# Patient Record
Sex: Female | Born: 1960
Health system: Southern US, Community
[De-identification: ages and names within clinical notes are randomized; demographics above are authoritative.]

## PROBLEM LIST (undated history)

## (undated) DIAGNOSIS — F419 Anxiety disorder, unspecified: Secondary | ICD-10-CM

## (undated) DIAGNOSIS — R51 Headache: Secondary | ICD-10-CM

## (undated) HISTORY — PX: SHOULDER ARTHROSCOPY: SHX128

---

## 2002-11-04 ENCOUNTER — Other Ambulatory Visit: Admission: RE | Admit: 2002-11-04 | Discharge: 2002-11-04 | Payer: Self-pay | Admitting: Obstetrics and Gynecology

## 2003-05-14 ENCOUNTER — Ambulatory Visit (HOSPITAL_BASED_OUTPATIENT_CLINIC_OR_DEPARTMENT_OTHER): Admission: RE | Admit: 2003-05-14 | Discharge: 2003-05-14 | Payer: Self-pay | Admitting: Orthopedic Surgery

## 2013-09-11 ENCOUNTER — Other Ambulatory Visit: Payer: Self-pay | Admitting: Obstetrics and Gynecology

## 2013-09-30 ENCOUNTER — Encounter (HOSPITAL_COMMUNITY): Payer: Self-pay | Admitting: *Deleted

## 2013-10-10 ENCOUNTER — Ambulatory Visit (HOSPITAL_COMMUNITY): Payer: Self-pay | Admitting: Anesthesiology

## 2013-10-10 ENCOUNTER — Encounter (HOSPITAL_COMMUNITY)
Admission: RE | Disposition: A | Discharge status: Home / Self Care | Payer: Self-pay | Source: Ambulatory Visit | Attending: Obstetrics and Gynecology

## 2013-10-10 ENCOUNTER — Encounter (HOSPITAL_COMMUNITY): Payer: Self-pay | Admitting: Pharmacy Technician

## 2013-10-10 ENCOUNTER — Ambulatory Visit (HOSPITAL_COMMUNITY)
Admission: RE | Admit: 2013-10-10 | Discharge: 2013-10-10 | Disposition: A | Discharge status: Home / Self Care | Payer: Self-pay | Source: Ambulatory Visit | Attending: Obstetrics and Gynecology | Admitting: Obstetrics and Gynecology

## 2013-10-10 ENCOUNTER — Encounter (HOSPITAL_COMMUNITY): Payer: Self-pay | Admitting: *Deleted

## 2013-10-10 ENCOUNTER — Encounter (HOSPITAL_COMMUNITY): Payer: Self-pay | Admitting: Anesthesiology

## 2013-10-10 DIAGNOSIS — N841 Polyp of cervix uteri: Secondary | ICD-10-CM | POA: Insufficient documentation

## 2013-10-10 DIAGNOSIS — N950 Postmenopausal bleeding: Secondary | ICD-10-CM | POA: Insufficient documentation

## 2013-10-10 DIAGNOSIS — N852 Hypertrophy of uterus: Secondary | ICD-10-CM | POA: Insufficient documentation

## 2013-10-10 HISTORY — PX: HYSTEROSCOPY W/D&C: SHX1775

## 2013-10-10 HISTORY — DX: Anxiety disorder, unspecified: F41.9

## 2013-10-10 HISTORY — DX: Headache: R51

## 2013-10-10 LAB — CBC
HCT: 38.2 % (ref 36.0–46.0)
Hemoglobin: 12.2 g/dL (ref 12.0–15.0)
MCH: 30.7 pg (ref 26.0–34.0)
MCHC: 31.9 g/dL (ref 30.0–36.0)
MCV: 96 fL (ref 78.0–100.0)
PLATELETS: 215 10*3/uL (ref 150–400)
RBC: 3.98 MIL/uL (ref 3.87–5.11)
RDW: 13.9 % (ref 11.5–15.5)
WBC: 2.7 10*3/uL — ABNORMAL LOW (ref 4.0–10.5)

## 2013-10-10 SURGERY — DILATATION AND CURETTAGE /HYSTEROSCOPY
Anesthesia: General | Site: Vagina

## 2013-10-10 MED ORDER — MEPERIDINE HCL 25 MG/ML IJ SOLN: 6.2500 mg | INTRAMUSCULAR | Status: DC | PRN

## 2013-10-10 MED ORDER — OXYCODONE-ACETAMINOPHEN 5-325 MG PO TABS: 1.0000 | ORAL_TABLET | ORAL | Status: DC | PRN

## 2013-10-10 MED ORDER — LIDOCAINE HCL (CARDIAC) 20 MG/ML IV SOLN
INTRAVENOUS | Status: AC
  Filled 2013-10-10: qty 5

## 2013-10-10 MED ORDER — FENTANYL CITRATE 0.05 MG/ML IJ SOLN
INTRAMUSCULAR | Status: DC | PRN
  Administered 2013-10-10 (×2): 50 ug via INTRAVENOUS

## 2013-10-10 MED ORDER — ONDANSETRON HCL 4 MG/2ML IJ SOLN
INTRAMUSCULAR | Status: AC
  Filled 2013-10-10: qty 2

## 2013-10-10 MED ORDER — DEXAMETHASONE SODIUM PHOSPHATE 10 MG/ML IJ SOLN
INTRAMUSCULAR | Status: DC | PRN
  Administered 2013-10-10: 10 mg via INTRAVENOUS

## 2013-10-10 MED ORDER — LIDOCAINE HCL 1 % IJ SOLN
INTRAMUSCULAR | Status: AC
  Filled 2013-10-10: qty 20

## 2013-10-10 MED ORDER — FENTANYL CITRATE 0.05 MG/ML IJ SOLN
INTRAMUSCULAR | Status: AC
  Filled 2013-10-10: qty 2

## 2013-10-10 MED ORDER — LACTATED RINGERS IV SOLN
INTRAVENOUS | Status: DC
  Administered 2013-10-10: 12:00:00 via INTRAVENOUS

## 2013-10-10 MED ORDER — KETOROLAC TROMETHAMINE 30 MG/ML IJ SOLN: 15.0000 mg | Freq: Once | INTRAMUSCULAR | Status: DC | PRN

## 2013-10-10 MED ORDER — KETOROLAC TROMETHAMINE 30 MG/ML IJ SOLN
INTRAMUSCULAR | Status: AC
  Filled 2013-10-10: qty 1

## 2013-10-10 MED ORDER — PROPOFOL 10 MG/ML IV EMUL
INTRAVENOUS | Status: AC
  Filled 2013-10-10: qty 20

## 2013-10-10 MED ORDER — MIDAZOLAM HCL 2 MG/2ML IJ SOLN
INTRAMUSCULAR | Status: AC
  Filled 2013-10-10: qty 2

## 2013-10-10 MED ORDER — DEXAMETHASONE SODIUM PHOSPHATE 10 MG/ML IJ SOLN
INTRAMUSCULAR | Status: AC
  Filled 2013-10-10: qty 1

## 2013-10-10 MED ORDER — KETOROLAC TROMETHAMINE 30 MG/ML IJ SOLN
INTRAMUSCULAR | Status: DC | PRN
  Administered 2013-10-10: 30 mg via INTRAVENOUS

## 2013-10-10 MED ORDER — MIDAZOLAM HCL 2 MG/2ML IJ SOLN: 0.5000 mg | Freq: Once | INTRAMUSCULAR | Status: DC | PRN

## 2013-10-10 MED ORDER — FENTANYL CITRATE 0.05 MG/ML IJ SOLN: 25.0000 ug | INTRAMUSCULAR | Status: DC | PRN

## 2013-10-10 MED ORDER — PROMETHAZINE HCL 25 MG/ML IJ SOLN: 6.2500 mg | INTRAMUSCULAR | Status: DC | PRN

## 2013-10-10 MED ORDER — LIDOCAINE HCL 1 % IJ SOLN
INTRAMUSCULAR | Status: DC | PRN
  Administered 2013-10-10: 10 mL

## 2013-10-10 MED ORDER — MIDAZOLAM HCL 2 MG/2ML IJ SOLN
INTRAMUSCULAR | Status: DC | PRN
  Administered 2013-10-10: 2 mg via INTRAVENOUS

## 2013-10-10 MED ORDER — PROPOFOL 10 MG/ML IV BOLUS
INTRAVENOUS | Status: DC | PRN
  Administered 2013-10-10: 160 mg via INTRAVENOUS

## 2013-10-10 MED ORDER — ONDANSETRON HCL 4 MG/2ML IJ SOLN
INTRAMUSCULAR | Status: DC | PRN
Start: 2013-10-10 — End: 2013-10-10
  Administered 2013-10-10: 4 mg via INTRAVENOUS

## 2013-10-10 MED ORDER — LIDOCAINE HCL (CARDIAC) 20 MG/ML IV SOLN
INTRAVENOUS | Status: DC | PRN
Start: 2013-10-10 — End: 2013-10-10
  Administered 2013-10-10: 30 mg via INTRAVENOUS

## 2013-10-10 MED ORDER — GLYCINE 1.5 % IR SOLN
Status: DC | PRN
  Administered 2013-10-10: 3000 mL

## 2013-10-10 SURGICAL SUPPLY — 22 items
CANISTER SUCT 3000ML (MISCELLANEOUS) ×3 IMPLANT
CATH ROBINSON RED A/P 16FR (CATHETERS) ×3 IMPLANT
CLOTH BEACON ORANGE TIMEOUT ST (SAFETY) ×3 IMPLANT
CONTAINER PREFILL 10% NBF 60ML (FORM) ×6 IMPLANT
DRAPE HYSTEROSCOPY (DRAPE) ×3 IMPLANT
DRSG TELFA 3X8 NADH (GAUZE/BANDAGES/DRESSINGS) ×3 IMPLANT
ELECT REM PT RETURN 9FT ADLT (ELECTROSURGICAL) ×3
ELECTRODE REM PT RTRN 9FT ADLT (ELECTROSURGICAL) ×1 IMPLANT
GLOVE BIO SURGEON STRL SZ 6.5 (GLOVE) ×2 IMPLANT
GLOVE BIO SURGEONS STRL SZ 6.5 (GLOVE) ×1
GLOVE BIOGEL PI IND STRL 6.5 (GLOVE) ×1 IMPLANT
GLOVE BIOGEL PI INDICATOR 6.5 (GLOVE) ×2
GOWN STRL REUS W/TWL LRG LVL3 (GOWN DISPOSABLE) ×6 IMPLANT
LOOP ANGLED CUTTING 22FR (CUTTING LOOP) IMPLANT
NEEDLE SPNL 22GX3.5 QUINCKE BK (NEEDLE) ×3 IMPLANT
PACK VAGINAL MINOR WOMEN LF (CUSTOM PROCEDURE TRAY) ×3 IMPLANT
PAD OB MATERNITY 4.3X12.25 (PERSONAL CARE ITEMS) ×3 IMPLANT
SET TUBING HYSTEROSCOPY 2 NDL (TUBING) ×3 IMPLANT
SYR CONTROL 10ML LL (SYRINGE) ×3 IMPLANT
TOWEL OR 17X24 6PK STRL BLUE (TOWEL DISPOSABLE) ×6 IMPLANT
TUBE HYSTEROSCOPY W Y-CONNECT (TUBING) ×3 IMPLANT
WATER STERILE IRR 1000ML POUR (IV SOLUTION) ×3 IMPLANT

## 2013-10-10 NOTE — Anesthesia Preprocedure Evaluation (Signed)
Anesthesia Evaluation  Patient identified by MRN, date of birth, ID band Patient awake    Reviewed: Allergy & Precautions, H&P , Patient's Chart, lab work & pertinent test results, reviewed documented beta blocker date and time   History of Anesthesia Complications Negative for: history of anesthetic complications  Airway Mallampati: II  TM Distance: >3 FB Neck ROM: full    Dental   Pulmonary  breath sounds clear to auscultation        Cardiovascular Exercise Tolerance: Good Rhythm:regular Rate:Normal     Neuro/Psych negative psych ROS   GI/Hepatic   Endo/Other    Renal/GU      Musculoskeletal   Abdominal   Peds  Hematology   Anesthesia Other Findings   Reproductive/Obstetrics                             Anesthesia Physical Anesthesia Plan  ASA: II  Anesthesia Plan: General LMA   Post-op Pain Management:    Induction:   Airway Management Planned:   Additional Equipment:   Intra-op Plan:   Post-operative Plan:   Informed Consent: I have reviewed the patients History and Physical, chart, labs and discussed the procedure including the risks, benefits and alternatives for the proposed anesthesia with the patient or authorized representative who has indicated his/her understanding and acceptance.   Dental Advisory Given  Plan Discussed with: CRNA, Surgeon and Anesthesiologist  Anesthesia Plan Comments:         Anesthesia Quick Evaluation  

## 2013-10-10 NOTE — Discharge Instructions (Signed)
DISCHARGE INSTRUCTIONS: D&C The following instructions have been prepared to help you care for yourself upon your return home.  MAY TAKE IBUPROFEN (MOTRIN, ADVIL) OR ALEVE AFTER 6:20 PM FOR PAIN!!!   Personal hygiene:  Use sanitary pads for vaginal drainage, not tampons.  Shower the day after your procedure.  NO tub baths, pools or Jacuzzis for 2-3 weeks.  Wipe front to back after using the bathroom.  Activity and limitations:  Do NOT drive or operate any equipment for 24 hours. The effects of anesthesia are still present and drowsiness may result.  Do NOT rest in bed all day.  Walking is encouraged.  Walk up and down stairs slowly.  You may resume your normal activity in one to two days or as indicated by your physician.  Sexual activity: NO intercourse for at least 2 weeks after the procedure, or as indicated by your physician.  Diet: Eat a light meal as desired this evening. You may resume your usual diet tomorrow.  Return to work: You may resume your work activities in one to two days or as indicated by your doctor.  What to expect after your surgery: Expect to have vaginal bleeding/discharge for 2-3 days and spotting for up to 10 days. It is not unusual to have soreness for up to 1-2 weeks. You may have a slight burning sensation when you urinate for the first day. Mild cramps may continue for a couple of days. You may have a regular period in 2-6 weeks.  Call your doctor for any of the following:  Excessive vaginal bleeding, saturating and changing one pad every hour.  Inability to urinate 6 hours after discharge from hospital.  Pain not relieved by pain medication.  Fever of 100.4 F or greater.  Unusual vaginal discharge or odor.   Call for an appointment:    Patients signature: ______________________  Nurses signature ________________________  Support person's signature_______________________

## 2013-10-10 NOTE — H&P (Signed)
53 y.o. complains of postcoital and postmenopausal bleeding.  Prior to her initial appt 03/14/2013 she had not had care in approx 10 yrs.  On initial exam enlarged uterus was found.  [place] was performed 04/04/13 and showed 10cm mass with obscured EMS, D&C ordered.  Pt failed to proceed.  Pap smear showed ASCUS cannot r/o HG, but HPV was negative.  Colpo was performed and no lesions visualized. ECC was benign.  She was contacted and situation discussed.  She is here today for schedule D&C hysteroscopy for further evaluation.  No past medical history on file. Past Surgical History  Procedure Laterality Date  . Shoulder arthroscopy      History   Social History  . Marital Status: Married    Spouse Name: N/A    Number of Children: N/A  . Years of Education: N/A   Occupational History  . Not on file.   Social History Main Topics  . Smoking status: Never Smoker   . Smokeless tobacco: Never Used  . Alcohol Use: No  . Drug Use: No  . Sexual Activity: Not on file   Other Topics Concern  . Not on file   Social History Narrative  . No narrative on file    No current facility-administered medications on file prior to encounter.   No current outpatient prescriptions on file prior to encounter.    Allergies not on file  @VITALS2 @  Lungs: clear to ascultation Cor:  RRR Abdomen:  soft, nontender, nondistended. Ex:  no cords, erythema Pelvic:  Deferred to OR  A:  Postmenopausal and postcoital bleeding with enlarged uterus containing mass   P:  D&C hysteroscopy.   All risks, benefits and alternatives d/w patient and she desires to proceed.  Routine pre-op care. No abx needed.   Allyn Kenner

## 2013-10-10 NOTE — Brief Op Note (Signed)
10/10/2013  12:28 PM  PATIENT:  Kelsey Gilmore  53 y.o. female  PRE-OPERATIVE DIAGNOSIS:  POSTMENOPAUSAL BLEEDING  POST-OPERATIVE DIAGNOSIS:  POSTMENOPAUSAL BLEEDING  PROCEDURE:  Fractional dilation, curettage and hysteroscopy, cervical polyp removal  SURGEON:  Surgeon(s) and Role:    Allyn Kenner, DO - Primary  ANESTHESIA:   general  EBL:  Total I/O In: -  Out: 25 [Urine:25]   DRAINS: none   LOCAL MEDICATIONS USED:  LIDOCAINE   SPECIMEN:  Source of Specimen:  ECC, EMC, cervical polyp  DISPOSITION OF SPECIMEN:  PATHOLOGY  COUNTS:  YES  PLAN OF CARE: Admit to inpatient   PATIENT DISPOSITION:  PACU - hemodynamically stable.

## 2013-10-10 NOTE — Anesthesia Postprocedure Evaluation (Signed)
Anesthesia Post Note  Patient: Kelsey Gilmore  Procedure(s) Performed: Procedure(s) (LRB): DILATATION AND CURETTAGE /HYSTEROSCOPY;  (N/A)  Anesthesia type: GA  Patient location: PACU  Post pain: Pain level controlled  Post assessment: Post-op Vital signs reviewed  Last Vitals:  Filed Vitals:   10/10/13 1300  BP:   Pulse: 55  Temp:   Resp: 23    Post vital signs: Reviewed  Level of consciousness: sedated  Complications: No apparent anesthesia complications

## 2013-10-10 NOTE — Transfer of Care (Signed)
Immediate Anesthesia Transfer of Care Note  Patient: Kelsey Gilmore  Procedure(s) Performed: Procedure(s): DILATATION AND CURETTAGE /HYSTEROSCOPY;  (N/A)  Patient Location: PACU  Anesthesia Type:General  Level of Consciousness: awake, oriented, sedated and patient cooperative  Airway & Oxygen Therapy: Patient Spontanous Breathing and Patient connected to nasal cannula oxygen  Post-op Assessment: Report given to PACU RN and Post -op Vital signs reviewed and stable  Post vital signs: Reviewed and stable  Complications: No apparent anesthesia complications

## 2013-10-11 NOTE — Op Note (Signed)
NAME:  Kelsey Gilmore, Kelsey Gilmore NO.:  [id number]  MEDICAL RECORD NO.:  [id number]  LOCATION:  [facility]                          FACILITY:  [facility]  PHYSICIAN:  Allyn Kenner, DO    DATE OF BIRTH:  10-Apr-1961  DATE OF PROCEDURE:  10/10/2013 DATE OF DISCHARGE:  10/10/2013                              OPERATIVE REPORT   PREOPERATIVE DIAGNOSIS:  Postmenopausal bleeding.  POSTOPERATIVE DIAGNOSIS:  Postmenopausal bleeding.  PROCEDURE:  Fractional dilation and curettage and hysteroscopy with cervical polyp removal.  SURGEON:  Allyn Kenner, DO  ANESTHESIA:  General.  URINE OUTPUT:  25 mL.  FLUIDS:  Please see anesthesia records.  ESTIMATED BLOOD LOSS:  Minimal.  LOCAL MEDICATION USED:  A 10 mL of 1% lidocaine for paracervical block.  SPECIMENS:  ECC, EMP, cervical polyp.  DISPOSITION:  To pathology.  FINDINGS:  Normal appearing endometrial cavity with uterus sounded to 10 cm.  A pedunculated cervical polyp noted within the cervical canal.  DESCRIPTION OF PROCEDURE:  The patient was taken to the operating room where anesthesia was administered and found to be adequate.  She was prepped and draped in the normal sterile fashion in dorsal lithotomy position.  A speculum was placed in the vagina and the anterior lip of the cervix was grasped with a single-tooth tenaculum.  The uterus was sounded to 10 cm, and the cervix was dilated to 25 Pratt.  A hysteroscope was introduced through the cervical canal, and the uterine cavity observed and found to be normal.  With removal of the hysteroscope, cervical polyp was noted within the cervical canal.  The hysteroscope was removed and gentle curettage of all 4 quadrants was performed.  The hysteroscope was reintroduced, and the cervical polyp was still present.  The hysteroscope was removed and polyp forceps were used to grasp the polyp and remove it.  The hysteroscope was introduced one final time and cervical polyp was found to  be absent.  All instruments were removed.  Tenaculum sites were found to be hemostatic.  No bleeding from cervical os was found.  The speculum was removed.  The patient tolerated the procedure well.  Sponge, lap, and needle counts were correct x2. The patient was taken to recovery in stable condition.          ______________________________ Allyn Kenner, DO     [place]/MEDQ  D:  10/10/2013  T:  10/11/2013  Job:  [id number]

## 2013-10-13 ENCOUNTER — Encounter (HOSPITAL_COMMUNITY): Payer: Self-pay | Admitting: Obstetrics and Gynecology

## 2020-06-01 ENCOUNTER — Emergency Department (HOSPITAL_COMMUNITY): Payer: Self-pay

## 2020-06-01 ENCOUNTER — Observation Stay (HOSPITAL_COMMUNITY): Payer: Self-pay

## 2020-06-01 ENCOUNTER — Encounter (HOSPITAL_COMMUNITY): Payer: Self-pay | Admitting: Internal Medicine

## 2020-06-01 ENCOUNTER — Other Ambulatory Visit: Payer: Self-pay

## 2020-06-01 ENCOUNTER — Inpatient Hospital Stay (HOSPITAL_COMMUNITY)
Admission: EM | Admit: 2020-06-01 | Discharge: 2020-06-04 | DRG: 809 | Disposition: A | Discharge status: Home / Self Care | Payer: Self-pay | Source: Ambulatory Visit | Attending: Internal Medicine | Admitting: Internal Medicine

## 2020-06-01 DIAGNOSIS — Z20822 Contact with and (suspected) exposure to covid-19: Secondary | ICD-10-CM | POA: Diagnosis present

## 2020-06-01 DIAGNOSIS — D259 Leiomyoma of uterus, unspecified: Secondary | ICD-10-CM | POA: Diagnosis present

## 2020-06-01 DIAGNOSIS — Z800 Family history of malignant neoplasm of digestive organs: Secondary | ICD-10-CM

## 2020-06-01 DIAGNOSIS — E538 Deficiency of other specified B group vitamins: Secondary | ICD-10-CM | POA: Diagnosis present

## 2020-06-01 DIAGNOSIS — K219 Gastro-esophageal reflux disease without esophagitis: Secondary | ICD-10-CM | POA: Diagnosis present

## 2020-06-01 DIAGNOSIS — B961 Klebsiella pneumoniae [K. pneumoniae] as the cause of diseases classified elsewhere: Secondary | ICD-10-CM | POA: Diagnosis present

## 2020-06-01 DIAGNOSIS — R112 Nausea with vomiting, unspecified: Secondary | ICD-10-CM | POA: Diagnosis present

## 2020-06-01 DIAGNOSIS — Y762 Prosthetic and other implants, materials and accessory obstetric and gynecological devices associated with adverse incidents: Secondary | ICD-10-CM | POA: Diagnosis present

## 2020-06-01 DIAGNOSIS — T8332XA Displacement of intrauterine contraceptive device, initial encounter: Secondary | ICD-10-CM | POA: Diagnosis present

## 2020-06-01 DIAGNOSIS — D539 Nutritional anemia, unspecified: Secondary | ICD-10-CM | POA: Diagnosis present

## 2020-06-01 DIAGNOSIS — N390 Urinary tract infection, site not specified: Secondary | ICD-10-CM | POA: Diagnosis present

## 2020-06-01 DIAGNOSIS — D61818 Other pancytopenia: Principal | ICD-10-CM | POA: Diagnosis present

## 2020-06-01 DIAGNOSIS — D649 Anemia, unspecified: Secondary | ICD-10-CM | POA: Diagnosis present

## 2020-06-01 LAB — COMPREHENSIVE METABOLIC PANEL
ALT: 35 U/L (ref 0–44)
AST: 127 U/L — ABNORMAL HIGH (ref 15–41)
Albumin: 4.6 g/dL (ref 3.5–5.0)
Alkaline Phosphatase: 42 U/L (ref 38–126)
Anion gap: 10 (ref 5–15)
BUN: 10 mg/dL (ref 6–20)
CO2: 29 mmol/L (ref 22–32)
Calcium: 9.7 mg/dL (ref 8.9–10.3)
Chloride: 102 mmol/L (ref 98–111)
Creatinine, Ser: 0.51 mg/dL (ref 0.44–1.00)
GFR, Estimated: >60 mL/min (ref >60)
Glucose, Bld: 96 mg/dL (ref 70–99)
Potassium: 3.5 mmol/L (ref 3.5–5.1)
Sodium: 141 mmol/L (ref 135–145)
Total Bilirubin: 0.9 mg/dL (ref 0.3–1.2)
Total Protein: 6.9 g/dL (ref 6.5–8.1)

## 2020-06-01 LAB — CBC
HCT: 14.7 % — ABNORMAL LOW (ref 36.0–46.0)
Hemoglobin: 4.8 g/dL — CL (ref 12.0–15.0)
MCH: 35.5 pg — ABNORMAL HIGH (ref 26.0–34.0)
MCH: 35.8 pg — ABNORMAL HIGH (ref 26.0–34.0)
MCHC: 32.7 g/dL (ref 30.0–36.0)
MCV: 109.7 fL — ABNORMAL HIGH (ref 80.0–100.0)
Platelets: 139 10*3/uL — ABNORMAL LOW (ref 150–400)
RBC: 1.34 MIL/uL — ABNORMAL LOW (ref 3.87–5.11)
RDW: 30.7 % — ABNORMAL HIGH (ref 11.5–15.5)
WBC: 3 10*3/uL — ABNORMAL LOW (ref 4.0–10.5)
WBC: 2.8 10*3/uL — ABNORMAL LOW (ref 4.0–10.5)
nRBC: 0.7 % — ABNORMAL HIGH (ref 0.0–0.2)

## 2020-06-01 LAB — PREPARE RBC (CROSSMATCH)

## 2020-06-01 LAB — DIFFERENTIAL
Abs Immature Granulocytes: 0.04 10*3/uL (ref 0.00–0.07)
Basophils Absolute: 0 10*3/uL (ref 0.0–0.1)
Basophils Relative: 0 %
Eosinophils Absolute: 0 10*3/uL (ref 0.0–0.5)
Eosinophils Relative: 1 %
Immature Granulocytes: 1 %
Lymphocytes Relative: 28 %
Lymphs Abs: 0.8 10*3/uL (ref 0.7–4.0)
Monocytes Absolute: 0.1 10*3/uL (ref 0.1–1.0)
Monocytes Relative: 4 %
Neutro Abs: 1.9 10*3/uL (ref 1.7–7.7)
Neutrophils Relative %: 66 %

## 2020-06-01 LAB — BPAM RBC
Blood Product Expiration Date: 202201052359
ISSUE DATE / TIME: 202112072105

## 2020-06-01 LAB — ABO/RH: ABO/RH(D): A POS

## 2020-06-01 LAB — RESP PANEL BY RT-PCR (FLU A&B, COVID) ARPGX2
Influenza A by PCR: NEGATIVE
Influenza B by PCR: NEGATIVE
SARS Coronavirus 2 by RT PCR: NEGATIVE

## 2020-06-01 LAB — IRON AND TIBC
Iron: 140 ug/dL (ref 28–170)
Saturation Ratios: 59 % — ABNORMAL HIGH (ref 10.4–31.8)
TIBC: 238 ug/dL — ABNORMAL LOW (ref 250–450)
UIBC: 98 ug/dL

## 2020-06-01 LAB — FOLATE: Folate: 22.5 ng/mL (ref >5.9)

## 2020-06-01 LAB — TECHNOLOGIST SMEAR REVIEW

## 2020-06-01 LAB — VITAMIN B12: Vitamin B-12: 65 pg/mL — ABNORMAL LOW (ref 180–914)

## 2020-06-01 LAB — RETICULOCYTES
Immature Retic Fract: 11 % (ref 2.3–15.9)
RBC.: 1.35 MIL/uL — ABNORMAL LOW (ref 3.87–5.11)
Retic Count, Absolute: 17.1 10*3/uL — ABNORMAL LOW (ref 19.0–186.0)
Retic Ct Pct: 1.3 % (ref 0.4–3.1)

## 2020-06-01 LAB — OCCULT BLOOD X 1 CARD TO LAB, STOOL: Fecal Occult Bld: NEGATIVE

## 2020-06-01 LAB — FERRITIN: Ferritin: 229 ng/mL (ref 11–307)

## 2020-06-01 MED ORDER — SODIUM CHLORIDE 0.9% FLUSH
3.0000 mL | Freq: Two times a day (BID) | INTRAVENOUS | Status: DC
  Administered 2020-06-03 – 2020-06-04 (×3): 3 mL via INTRAVENOUS

## 2020-06-01 MED ORDER — ONDANSETRON HCL 4 MG/2ML IJ SOLN: 4.0000 mg | Freq: Four times a day (QID) | INTRAMUSCULAR | Status: DC | PRN

## 2020-06-01 MED ORDER — SODIUM CHLORIDE 0.9 % IV SOLN: 250.0000 mL | INTRAVENOUS | Status: DC | PRN

## 2020-06-01 MED ORDER — PANTOPRAZOLE SODIUM 40 MG PO TBEC
40.0000 mg | DELAYED_RELEASE_TABLET | Freq: Two times a day (BID) | ORAL | Status: DC
  Administered 2020-06-01: 40 mg via ORAL
  Filled 2020-06-01: qty 1

## 2020-06-01 MED ORDER — ACETAMINOPHEN 650 MG RE SUPP: 650.0000 mg | Freq: Four times a day (QID) | RECTAL | Status: DC | PRN

## 2020-06-01 MED ORDER — IOHEXOL 300 MG/ML  SOLN
100.0000 mL | Freq: Once | INTRAMUSCULAR | Status: AC | PRN
  Administered 2020-06-01: 100 mL via INTRAVENOUS

## 2020-06-01 MED ORDER — SODIUM CHLORIDE 0.9% IV SOLUTION
Freq: Once | INTRAVENOUS | Status: AC
  Administered 2020-06-01: 10 mL/h via INTRAVENOUS

## 2020-06-01 MED ORDER — ACETAMINOPHEN 325 MG PO TABS: 650.0000 mg | ORAL_TABLET | Freq: Four times a day (QID) | ORAL | Status: DC | PRN

## 2020-06-01 MED ORDER — ONDANSETRON HCL 4 MG PO TABS: 4.0000 mg | ORAL_TABLET | Freq: Four times a day (QID) | ORAL | Status: DC | PRN

## 2020-06-01 MED ORDER — SODIUM CHLORIDE 0.9% FLUSH: 3.0000 mL | INTRAVENOUS | Status: DC | PRN

## 2020-06-01 NOTE — ED Notes (Signed)
Pt getting first unit of blood now unable to collect labs at this time.

## 2020-06-01 NOTE — ED Provider Notes (Signed)
[facility] EMERGENCY DEPARTMENT Provider Note   CSN: [id number] Arrival date & time: 06/01/20  1328     History Chief Complaint  Patient presents with  . Abnormal Lab    Kelsey Gilmore is a 59 y.o. female presenting for evaluation of weakness and low blood counts.  Patient states she has been feeling weaker than normal.  She reports associated lightheadedness, mostly when standing up.  This is been going on for several months, but worsened in the past week.  She reports associated intermittent nausea and vomiting, she is not currently having any nausea.  She intermittently has lower/mid abdominal pain, none currently.  She denies fevers, chills, chest pain, shortness of breath, urinary symptoms, abnormal bowel movements.  She reports no medical problems, but states she does not see a doctor regularly.  Due to her weakness, she saw her doctor yesterday, had blood work drawn which showed a hemoglobin of 5.5, she was directed to the ER.  She reports denies a previous history of anemia.  She has never needed a blood transfusion.  She does not currently have periods.  She denies blood in her stool, urine, or emesis.  She is not on blood thinners.  She takes no medicines daily.  She does not take NSAIDs.  She states she has never seen a GI doctor, never had a colonoscopy.  HPI     Past Medical History:  Diagnosis Date  . Anxiety    no meds  . Headache(784.0)    migraines occasional - last one 1 month ago - otc med prn  . SVD (spontaneous vaginal delivery)    x 1    There are no problems to display for this patient.   Past Surgical History:  Procedure Laterality Date  . HYSTEROSCOPY WITH D & C N/A 10/10/2013   Procedure: DILATATION AND CURETTAGE /HYSTEROSCOPY; ;  Surgeon: Allyn Kenner, DO;  Location: [facility];  Service: Gynecology;  Laterality: N/A;  . SHOULDER ARTHROSCOPY     left     OB History   No obstetric history on file.     No family history on  file.  Social History   Tobacco Use  . Smoking status: Never Smoker  . Smokeless tobacco: Never Used  Substance Use Topics  . Alcohol use: Yes    Comment: ocaasional  . Drug use: No    Home Medications Prior to Admission medications   Medication Sig Start Date End Date Taking? Authorizing Provider  oxyCODONE-acetaminophen (ROXICET) 5-325 MG per tablet Take 1 tablet by mouth every 4 (four) hours as needed for severe pain. 10/10/13   Allyn Kenner, DO    Allergies    Patient has no known allergies.  Review of Systems   Review of Systems  Gastrointestinal: Positive for abdominal pain (intermittent) and vomiting (intermittent).  Neurological: Positive for weakness and light-headedness.  All other systems reviewed and are negative.   Physical Exam Updated Vital Signs BP (!) 123/52   Pulse 79   Temp 98.3 F (36.8 C)   Resp 19   Ht _0  (1.727 m)   Wt 78.9 kg   SpO2 100%   BMI 26.45 kg/m   Physical Exam Vitals and nursing note reviewed. Exam conducted with a chaperone present.  Constitutional:      General: She is not in acute distress.    Appearance: She is well-developed.     Comments: Appears pale  HENT:     Head: Normocephalic and atraumatic.  Mouth/Throat:     Mouth: Mucous membranes are pale.  Eyes:     Extraocular Movements: Extraocular movements intact.     Conjunctiva/sclera: Conjunctivae normal.     Pupils: Pupils are equal, round, and reactive to light.  Cardiovascular:     Rate and Rhythm: Normal rate and regular rhythm.     Pulses: Normal pulses.  Pulmonary:     Effort: Pulmonary effort is normal. No respiratory distress.     Breath sounds: Normal breath sounds. No wheezing.  Abdominal:     General: There is no distension.     Palpations: Abdomen is soft. There is no mass.     Tenderness: There is no abdominal tenderness. There is no guarding or rebound.     Comments: No TTP.  Firmness of lower abdomen.  No rigidity, guarding, distention.   Genitourinary:    Comments: No gross bleeding noted on rectal exam.  Hemoccult sent. Musculoskeletal:        General: Normal range of motion.     Cervical back: Normal range of motion and neck supple.  Skin:    General: Skin is warm and dry.     Capillary Refill: Capillary refill takes less than 2 seconds.     Coloration: Skin is pale.  Neurological:     Mental Status: She is alert and oriented to person, place, and time.     ED Results / Procedures / Treatments   Labs (all labs ordered are listed, but only abnormal results are displayed) Labs Reviewed  COMPREHENSIVE METABOLIC PANEL - Abnormal; Notable for the following components:      Result Value   AST 127 (*)    All other components within normal limits  CBC - Abnormal; Notable for the following components:   WBC 3.0 (*)    nRBC 1.0 (*)    All other components within normal limits  RESP PANEL BY RT-PCR (FLU A&B, COVID) ARPGX2  DIFFERENTIAL  VITAMIN B12  FOLATE  IRON AND TIBC  FERRITIN  RETICULOCYTES  OCCULT BLOOD X 1 CARD TO LAB, STOOL  POC OCCULT BLOOD, ED  TYPE AND SCREEN  ABO/RH  PREPARE RBC (CROSSMATCH)    EKG None  Radiology No results found.  Procedures .Critical Care Performed by: Franchot Heidelberg, PA-C Authorized by: Franchot Heidelberg, PA-C   Critical care provider statement:    Critical care time (minutes):  45   Critical care time was exclusive of:  Separately billable procedures and treating other patients and teaching time   Critical care was necessary to treat or prevent imminent or life-threatening deterioration of the following conditions:  Circulatory failure   Critical care was time spent personally by me on the following activities:  Blood draw for specimens, development of treatment plan with patient or surrogate, evaluation of patient's response to treatment, examination of patient, obtaining history from patient or surrogate, ordering and performing treatments and interventions,  ordering and review of laboratory studies, re-evaluation of patient's condition, pulse oximetry, ordering and review of radiographic studies and review of old charts   I assumed direction of critical care for this patient from another provider in my specialty: no   Comments:     Pt with critically low hgb of 5.4, requiring blood transfusion and admission.    (including critical care time)  Medications Ordered in ED Medications  0.9 %  sodium chloride infusion (Manually program via Guardrails IV Fluids) (has no administration in time range)    ED Course  I have reviewed  the triage vital signs and the nursing notes.  Pertinent labs & imaging results that were available during my care of the patient were reviewed by me and considered in my medical decision making (see chart for details).    MDM Rules/Calculators/A&P                          Patient presenting for evaluation of generalized weakness and low blood counts.  On exam, patient appears pale in the conjunctive a, mucous membranes, palms of the hand.  No gross bleeding noted on rectal exam, Hemoccult sent.  Labs obtained from triage interpreted by me, shows hemoglobin of 5.4.  Mild elevation in AST, however alk phos and ALT normal.  This correlates with patient's lab work from yesterday.  Will obtain CT imaging due to firmness of the lower abdomen to rule out mass, as well as further evaluation of the gallbladder due to intermittent vomiting and elevated AST.  Patient will also need a blood transfusion due to her symptomatic anemia.  Will call for admission.  Discussed with Dr. [place] Pink from [facility] hospitalist service, patient to be admitted.  Final Clinical Impression(s) / ED Diagnoses Final diagnoses:  Symptomatic anemia    Rx / DC Orders ED Discharge Orders    None       Franchot Heidelberg, PA-C 06/01/20 1717    Tegeler, Gwenyth Allegra, MD 06/01/20 1730

## 2020-06-01 NOTE — ED Notes (Signed)
Patient transported to CT 

## 2020-06-01 NOTE — ED Triage Notes (Signed)
Pt was called by her PCP today to inform her that her Hgb yesterday was 5.5. Pt feeling generalized weakness.

## 2020-06-01 NOTE — ED Notes (Signed)
Received pt from previous RN with ongoing 2nd unit of blood transfusion, pt denies any chest pain, shortness of breath or itching. Call bell within reach.

## 2020-06-01 NOTE — H&P (Signed)
[facility] History and Physical  Kelsey Gilmore ZOX:[id number] DOB: 1960/09/10 DOA: 06/01/2020   PCP: No primary care provider on file.  Specialists: None  Chief Complaint: Shortness of breath with exertion  HPI: Kelsey Gilmore is a 59 y.o. female with no significant past medical history who does not go to doctors for regular care.  She is a very poor historian.  Her daughter is at the bedside.  Apparently the patient has been feeling more fatigued than usual with shortness of breath with exertion over the past few weeks.  She also mentions other symptoms including nausea and vomiting.  She has had acid reflux over the past several weeks.  She thinks she may have lost some weight but unable to specifically quantify.  She thinks it may be 10 pounds.  Denies any blood in the stool, black-colored stool or blood in the urine or vaginal bleeding or spotting.  Denies any abdominal pain per se but her sister thinks that she may have had some abdominal discomfort over the last few weeks.  Once again patient is a very poor historian.  She went to the urgent care center and had blood work done which apparently revealed a hemoglobin of 5.5.  She was asked to go to the emergency department.  Blood work in the ED here also shows similar hemoglobin.  Patient has never had a colonoscopy.  There is family history of colon cancer.  Patient will be brought in for blood transfusion and may need endoscopy.  Home Medications: Prior to Admission medications   Medication Sig Start Date End Date Taking? Authorizing Provider  oxyCODONE-acetaminophen (ROXICET) 5-325 MG per tablet Take 1 tablet by mouth every 4 (four) hours as needed for severe pain. 10/10/13   Allyn Kenner, DO    Allergies: No Known Allergies  Past Medical History: Past Medical History:  Diagnosis Date  . Anxiety    no meds  . Headache(784.0)    migraines occasional - last one 1 month ago - otc med prn  . SVD (spontaneous vaginal  delivery)    x 1    Past Surgical History:  Procedure Laterality Date  . HYSTEROSCOPY WITH D & C N/A 10/10/2013   Procedure: DILATATION AND CURETTAGE /HYSTEROSCOPY; ;  Surgeon: Allyn Kenner, DO;  Location: [facility];  Service: Gynecology;  Laterality: N/A;  . SHOULDER ARTHROSCOPY     left    Social History: She denies smoking alcohol use or recreational drug use.  Usually independent with daily activities.  She mentions that she is quite active at baseline.  Family History:  Family History  Problem Relation Age of Onset  . Colon cancer Mother   There is also colon cancer in her aunts on her mother side.  There is also history of other kinds of cancer including prostate cancer and breast cancer in the family.   Review of Systems - History obtained from the patient General ROS: positive for  - fatigue and weight loss Psychological ROS: negative Ophthalmic ROS: negative ENT ROS: negative Allergy and Immunology ROS: negative Hematological and Lymphatic ROS: positive for - weight loss Endocrine ROS: negative Respiratory ROS: As in HPI Cardiovascular ROS: As in HPI Gastrointestinal ROS: positive for - heartburn and nausea/vomiting Genito-Urinary ROS: no dysuria, trouble voiding, or hematuria Musculoskeletal ROS: negative Neurological ROS: no TIA or stroke symptoms Dermatological ROS: negative  Physical Examination  Vitals:   06/01/20 1705 06/01/20 1711 06/01/20 1715 06/01/20 1726  BP:   124/65 (!) 122/59  Pulse:  89  77 77  Resp: 17  16 17   Temp:  98 F (36.7 C)  98.1 F (36.7 C)  TempSrc:  Oral  Oral  SpO2: 94%  100% 100%  Weight:      Height:        BP (!) 122/59 (BP Location: Right Arm)   Pulse 77   Temp 98.1 F (36.7 C) (Oral)   Resp 17   Ht 5\' 8"  (1.727 m)   Wt 78.9 kg   SpO2 100%   BMI 26.45 kg/m   General appearance: alert, cooperative, appears stated age and no distress Head: Normocephalic, without obvious abnormality, atraumatic Eyes: Pallor noted.  Muddy sclera.  Extraocular movements are intact. Throat: lips, mucosa, and tongue normal; teeth and gums normal Back: symmetric, no curvature. ROM normal. No CVA tenderness. Resp: clear to auscultation bilaterally Cardio: regular rate and rhythm, S1, S2 normal, no murmur, click, rub or gallop GI: Abdomen is soft.  Fullness appreciated in the suprapubic area.  Nontender.  No ulcers or organomegaly appreciated. Extremities: extremities normal, atraumatic, no cyanosis or edema Pulses: 2+ and symmetric Skin: Skin color, texture, turgor normal. No rashes or lesions Lymph nodes: Cervical, supraclavicular, and axillary nodes normal. Neurologic: Alert and oriented x3.  No obvious focal neurological deficits.    Labs on Admission: I have personally reviewed following labs and imaging studies  CBC: Recent Labs  Lab 06/01/20 1433  WBC 3.0*  HGB 5.4*  HCT 16.9*  MCV 111.2*  PLT 161   Basic Metabolic Panel: Recent Labs  Lab 06/01/20 1433  NA 141  K 3.5  CL 102  CO2 29  GLUCOSE 96  BUN 10  CREATININE 0.51  CALCIUM 9.7   GFR: Estimated Creatinine Clearance: 82.5 mL/min (by C-G formula based on SCr of 0.51 mg/dL). Liver Function Tests: Recent Labs  Lab 06/01/20 1433  AST 127*  ALT 35  ALKPHOS 42  BILITOT 0.9  PROT 6.9  ALBUMIN 4.6     Radiological Exams on Admission: CT abdomen pelvis is pending    Problem List  Active Problems:   Symptomatic anemia   Macrocytic anemia   GERD (gastroesophageal reflux disease)   Nausea and vomiting   Assessment: This is a 59 year old African-American female with no significant past medical history who presents with a few week history of nausea, vomiting, abdominal pain, fatigue and is found to have severe anemia.  She has macrocytic anemia.  Etiology for anemia is not clear.  No acute bleeding has been noted.  Hemoccult is negative.  However there is a significant family history of colon cancer.  Other differentials include  hemolytic anemia although patient does not have any other systemic symptoms and her bilirubin is noted to be normal.   Plan:  1. Macrocytic anemia with significant symptoms: Patient will be transfused PRBCs.  2 units have been ordered.  Anemia panel is pending.  She is noted to be leukopenic as well. We will also check LDH haptoglobin.  Peripheral smear examination.  2. Nausea vomiting with symptoms of heartburn: Reason for her symptoms not entirely clear.  We will place her on PPI.  Follow-up on CT scan of the abdomen pelvis.  There is fullness in her suprapubic area.  She also mentions weight loss.  Discussed with gastroenterology.  Considering her symptoms as well as her severe anemia may need to consider endoscopy this individual.  Will wait and see what her CT scan and her anemia work-up reveals.  Since she usually does  not follow-up with doctors it may be beneficial to do this while she is in the hospital.  3. Elevated AST: Patient denies any history of alcohol use.  Rest of LFTs are normal.  Recheck tomorrow.  Check hepatitis panel.   DVT Prophylaxis: SCDs for now Code Status: Full code Family Communication: Discussed with the patient and her daughter Disposition: Hopefully return home when improved Consults called: [facility] Admission Status: Status is: Observation  The patient remains OBS appropriate and will d/c before 2 midnights.  Dispo: The patient is from: Home              Anticipated d/c is to: Home              Anticipated d/c date is: 1 day              Patient currently is not medically stable to d/c.     Severity of Illness: The appropriate patient status for this patient is OBSERVATION. Observation status is judged to be reasonable and necessary in order to provide the required intensity of service to ensure the patient's safety. The patient's presenting symptoms, physical exam findings, and initial radiographic and laboratory data in the context of their  medical condition is felt to place them at decreased risk for further clinical deterioration. Furthermore, it is anticipated that the patient will be medically stable for discharge from the hospital within 2 midnights of admission. The following factors support the patient status of observation.   " The patient's presenting symptoms include fatigue. " The physical exam findings include pallor. " The initial radiographic and laboratory data are significant for severe anemia.     Further management decisions will depend on results of further testing and patient's response to treatment.   Kelsey Gilmore  [facility] operational officer on [email or web address]  06/01/2020, 5:29 PM

## 2020-06-02 ENCOUNTER — Observation Stay (HOSPITAL_COMMUNITY): Payer: Self-pay

## 2020-06-02 ENCOUNTER — Encounter (HOSPITAL_COMMUNITY): Payer: Self-pay | Admitting: Internal Medicine

## 2020-06-02 DIAGNOSIS — D649 Anemia, unspecified: Secondary | ICD-10-CM

## 2020-06-02 DIAGNOSIS — D539 Nutritional anemia, unspecified: Secondary | ICD-10-CM

## 2020-06-02 DIAGNOSIS — Z800 Family history of malignant neoplasm of digestive organs: Secondary | ICD-10-CM

## 2020-06-02 DIAGNOSIS — K219 Gastro-esophageal reflux disease without esophagitis: Secondary | ICD-10-CM

## 2020-06-02 LAB — TYPE AND SCREEN: Unit division: 0

## 2020-06-02 LAB — CBC
HCT: 16.9 % — ABNORMAL LOW (ref 36.0–46.0)
HCT: 21.1 % — ABNORMAL LOW (ref 36.0–46.0)
Hemoglobin: 5.4 g/dL — CL (ref 12.0–15.0)
Hemoglobin: 6.8 g/dL — CL (ref 12.0–15.0)
MCH: 32.7 pg (ref 26.0–34.0)
MCHC: 32 g/dL (ref 30.0–36.0)
MCHC: 32.2 g/dL (ref 30.0–36.0)
MCV: 111.2 fL — ABNORMAL HIGH (ref 80.0–100.0)
MCV: 101.4 fL — ABNORMAL HIGH (ref 80.0–100.0)
Platelets: 158 10*3/uL (ref 150–400)
Platelets: 114 10*3/uL — ABNORMAL LOW (ref 150–400)
RBC: 1.52 MIL/uL — ABNORMAL LOW (ref 3.87–5.11)
RBC: 2.08 MIL/uL — ABNORMAL LOW (ref 3.87–5.11)
RDW: 30.5 % — ABNORMAL HIGH (ref 11.5–15.5)
WBC: 2.8 10*3/uL — ABNORMAL LOW (ref 4.0–10.5)
nRBC: 1 % — ABNORMAL HIGH (ref 0.0–0.2)
nRBC: 0.7 % — ABNORMAL HIGH (ref 0.0–0.2)

## 2020-06-02 LAB — COMPREHENSIVE METABOLIC PANEL
ALT: 33 U/L (ref 0–44)
AST: 124 U/L — ABNORMAL HIGH (ref 15–41)
Albumin: 4 g/dL (ref 3.5–5.0)
Alkaline Phosphatase: 36 U/L — ABNORMAL LOW (ref 38–126)
Anion gap: 11 (ref 5–15)
BUN: 9 mg/dL (ref 6–20)
CO2: 27 mmol/L (ref 22–32)
Calcium: 9.3 mg/dL (ref 8.9–10.3)
Chloride: 104 mmol/L (ref 98–111)
Creatinine, Ser: 0.54 mg/dL (ref 0.44–1.00)
GFR, Estimated: >60 mL/min (ref >60)
Glucose, Bld: 90 mg/dL (ref 70–99)
Potassium: 3.2 mmol/L — ABNORMAL LOW (ref 3.5–5.1)
Sodium: 142 mmol/L (ref 135–145)
Total Bilirubin: 1.1 mg/dL (ref 0.3–1.2)
Total Protein: 5.8 g/dL — ABNORMAL LOW (ref 6.5–8.1)

## 2020-06-02 LAB — PREPARE RBC (CROSSMATCH)

## 2020-06-02 LAB — HEMOGLOBIN AND HEMATOCRIT, BLOOD
HCT: 26.6 % — ABNORMAL LOW (ref 36.0–46.0)
Hemoglobin: 8.9 g/dL — ABNORMAL LOW (ref 12.0–15.0)

## 2020-06-02 LAB — URINALYSIS, ROUTINE W REFLEX MICROSCOPIC
Glucose, UA: NEGATIVE mg/dL
Ketones, ur: 20 mg/dL — AB
Nitrite: NEGATIVE
Protein, ur: 100 mg/dL — AB
Specific Gravity, Urine: 1.03 (ref 1.005–1.030)
WBC, UA: >50 WBC/hpf — ABNORMAL HIGH (ref 0–5)
pH: 6 (ref 5.0–8.0)

## 2020-06-02 LAB — PROTIME-INR
INR: 1.2 (ref 0.8–1.2)
Prothrombin Time: 14.7 seconds (ref 11.4–15.2)

## 2020-06-02 LAB — HEPATITIS PANEL, ACUTE
HCV Ab: NONREACTIVE
Hep A IgM: NONREACTIVE
Hep B C IgM: NONREACTIVE
Hepatitis B Surface Ag: NONREACTIVE

## 2020-06-02 LAB — LACTATE DEHYDROGENASE: LDH: 5275 U/L — ABNORMAL HIGH (ref 98–192)

## 2020-06-02 LAB — MAGNESIUM: Magnesium: 2.2 mg/dL (ref 1.7–2.4)

## 2020-06-02 LAB — HIV ANTIBODY (ROUTINE TESTING W REFLEX): HIV Screen 4th Generation wRfx: NONREACTIVE

## 2020-06-02 LAB — BPAM RBC: Unit Type and Rh: 6200

## 2020-06-02 LAB — TSH: TSH: 3.74 u[IU]/mL (ref 0.350–4.500)

## 2020-06-02 MED ORDER — CYANOCOBALAMIN 1000 MCG/ML IJ SOLN
1000.0000 ug | Freq: Every day | INTRAMUSCULAR | Status: DC
  Administered 2020-06-02 – 2020-06-04 (×3): 1000 ug via INTRAMUSCULAR
  Filled 2020-06-02 (×3): qty 1

## 2020-06-02 MED ORDER — PANTOPRAZOLE SODIUM 40 MG PO TBEC
40.0000 mg | DELAYED_RELEASE_TABLET | Freq: Every day | ORAL | Status: DC
  Administered 2020-06-02 – 2020-06-04 (×3): 40 mg via ORAL
  Filled 2020-06-02 (×3): qty 1

## 2020-06-02 MED ORDER — ADULT MULTIVITAMIN W/MINERALS CH
1.0000 | ORAL_TABLET | Freq: Every day | ORAL | Status: DC
  Administered 2020-06-02 – 2020-06-04 (×3): 1 via ORAL
  Filled 2020-06-02 (×3): qty 1

## 2020-06-02 MED ORDER — SODIUM CHLORIDE 0.9% IV SOLUTION: Freq: Once | INTRAVENOUS | Status: DC

## 2020-06-02 MED ORDER — GADOBUTROL 1 MMOL/ML IV SOLN
7.5000 mL | Freq: Once | INTRAVENOUS | Status: AC | PRN
  Administered 2020-06-02: 7.5 mL via INTRAVENOUS

## 2020-06-02 MED ORDER — ENSURE ENLIVE PO LIQD
237.0000 mL | Freq: Two times a day (BID) | ORAL | Status: DC
  Administered 2020-06-02 – 2020-06-04 (×3): 237 mL via ORAL

## 2020-06-02 MED ORDER — POTASSIUM CHLORIDE CRYS ER 20 MEQ PO TBCR
40.0000 meq | EXTENDED_RELEASE_TABLET | Freq: Once | ORAL | Status: AC
  Administered 2020-06-02: 40 meq via ORAL
  Filled 2020-06-02: qty 2

## 2020-06-02 MED ORDER — SODIUM CHLORIDE 0.9 % IV SOLN
1.0000 g | INTRAVENOUS | Status: DC
  Administered 2020-06-02 – 2020-06-03 (×2): 1 g via INTRAVENOUS
  Filled 2020-06-02 (×2): qty 10

## 2020-06-02 NOTE — ED Notes (Signed)
Report called to Shayne Alken, RN

## 2020-06-02 NOTE — Plan of Care (Signed)
  Problem: Education: Goal: Knowledge of General Education information will improve Description Including pain rating scale, medication(s)/side effects and non-pharmacologic comfort measures Outcome: Progressing   

## 2020-06-02 NOTE — Consult Note (Addendum)
[facility] Consult: 8:44 AM 06/02/2020  LOS: 0 days    Referring Provider: Dr Curly Rim  Primary Care Physician: None, never goes to the doctor. Primary Gastroenterologist:  Althia Forts    Reason for Consultation: Macrocytic anemia   HPI: Kelsey Gilmore is a 59 y.o. female.  PMH anxiety.  Occasional headaches.  Surgeries include hysteroscopy, D&C, shoulder arthroscopy. Only listed med is Roxicet as needed.  Experiencing fatigue, shortness of breath over the past few weeks.  Acid reflux over several weeks with green/bilious/nonbloody emesis but not necessarily nausea.  Emesis/regurgitation not related to p.o. intake.  Mild discomfort in her lower abdomen for a few months..  Possible 10 pound weight loss.  Anorexia.  She eats a lot of fruit and legumes but overall p.o. intake has decreased over the last few months.  BMs are brown and occur once or twice a week which has been her norm for many years.  Never saw bloody or tarry stools.  Normally she is pretty active around her house and helping out with her 65 year old mother who lives nearby but lately has had no energy to do much of anything. Hgb 5.5 at urgent care yesterday and sent to ED. Now admitted, Hgb 6.8 after PRBC.  Pancytopenia as WBCs 2.8, platelets 114.  MCV 109.  B12 low.  Iron, iron sats, ferritin, folate okay.  Low TIBC. Chemistries with K3.2.  Other than AST of 124 (ALT 33), LFTs within normal limits. FOBT negative.  CTAP w contrast shows tiny hypodense lesions scattered throughout the liver, the largest is 1.5 cm on the right.  PV patent.  Pancreas, stomach, bowel unremarkable.  Moderate stool in colon large.  9 cm submucosal intrauterine fibroid displacing IUD.  Several other calcified uterine fibroids. Trace bil pleural effusions.  The idea  of pursuing EGD and colonoscopy was discussed with patient.  She does not like to incur financial debt and is not sure whether she wants to pursue a lot of medical testing.  Family history positive for colon cancer.  Her mother had colon cancer in her 7s or 38s but is alive at 59.  She has 3 sisters, one of them has some sort of cancer that metastasized to her bones.  Unclear types of cancers in her maternal aunts.\  Patient lives alone in [place].  She was divorced in 2011.  No history of alcohol use, tobacco.  Pt denies ETOH use.     Past Medical History:  Diagnosis Date  . Anxiety    no meds  . Headache(784.0)    migraines occasional - last one 1 month ago - otc med prn  . SVD (spontaneous vaginal delivery)    x 1    Past Surgical History:  Procedure Laterality Date  . HYSTEROSCOPY WITH D & C N/A 10/10/2013   Procedure: DILATATION AND CURETTAGE /HYSTEROSCOPY; ;  Surgeon: Allyn Kenner, DO;  Location: [facility];  Service: Gynecology;  Laterality: N/A;  . SHOULDER ARTHROSCOPY     left    Prior to Admission medications   Medication Sig Start  Date End Date Taking? Authorizing Provider  oxyCODONE-acetaminophen (ROXICET) 5-325 MG per tablet Take 1 tablet by mouth every 4 (four) hours as needed for severe pain. Patient not taking: Reported on 06/01/2020 10/10/13   Allyn Kenner, DO    Scheduled Meds: . sodium chloride   Intravenous Once  . pantoprazole  40 mg Oral BID  . sodium chloride flush  3 mL Intravenous Q12H   Infusions: . sodium chloride     PRN Meds: sodium chloride, acetaminophen **OR** acetaminophen, ondansetron **OR** ondansetron (ZOFRAN) IV, sodium chloride flush   Allergies as of 06/01/2020  . (No Known Allergies)    Family History  Problem Relation Age of Onset  . Colon cancer Mother     Social History   Socioeconomic History  . Marital status: Divorced    Spouse name: Not on file  . Number of children: Not on file  . Years of education: Not  on file  . Highest education level: Not on file  Occupational History  . Not on file  Tobacco Use  . Smoking status: Never Smoker  . Smokeless tobacco: Never Used  Substance and Sexual Activity  . Alcohol use: Yes    Comment: ocaasional  . Drug use: No  . Sexual activity: Yes    Birth control/protection: Post-menopausal  Other Topics Concern  . Not on file  Social History Narrative  . Not on file   Social Determinants of Health   Financial Resource Strain:   . Difficulty of Paying Living Expenses: Not on file  Food Insecurity:   . Worried About Charity fundraiser in the Last Year: Not on file  . Ran Out of Food in the Last Year: Not on file  Transportation Needs:   . Lack of Transportation (Medical): Not on file  . Lack of Transportation (Non-Medical): Not on file  Physical Activity:   . Days of Exercise per Week: Not on file  . Minutes of Exercise per Session: Not on file  Stress:   . Feeling of Stress : Not on file  Social Connections:   . Frequency of Communication with Friends and Family: Not on file  . Frequency of Social Gatherings with Friends and Family: Not on file  . Attends Religious Services: Not on file  . Active Member of Clubs or Organizations: Not on file  . Attends Archivist Meetings: Not on file  . Marital Status: Not on file  Intimate Partner Violence:   . Fear of Current or Ex-Partner: Not on file  . Emotionally Abused: Not on file  . Physically Abused: Not on file  . Sexually Abused: Not on file    REVIEW OF SYSTEMS: Constitutional: Weakness, fatigue.  This is what prompted her visit to urgent care yesterday ENT:  No nose bleeds Pulm: Some dyspnea on exertion, no cough. CV:  No palpitations, no LE edema.  No angina GU:  No hematuria, no frequency GI: See HPI Heme: See HPI.  Denies unusual bleeding or bruising. Transfusions: Prior to the last 24 hours, no previous blood transfusions. Neuro: Numbness in the tips of her fingers.   No headaches, no peripheral tingling or numbness.  No syncope, no seizures Derm:  No itching, no rash or sores.  Endocrine:  No sweats or chills.  No polyuria or dysuria Immunization: Not queried Travel:  None beyond local counties in last few months.    PHYSICAL EXAM: Vital signs in last 24 hours: Vitals:   06/02/20 0700 06/02/20 0715  BP:  121/64 (!) 126/59  Pulse: 61 62  Resp: 20 16  Temp:  98.4 F (36.9 C)  SpO2: 99% 99%   Wt Readings from Last 3 Encounters:  06/01/20 78.9 kg  10/10/13 78.9 kg    General: Well-appearing, comfortable appearing Head: No facial asymmetry or swelling.  No signs of head trauma Eyes: Conjunctiva pale.  No scleral icterus.  EOMI Ears: Not hard of hearing Nose: Congestion or discharge Mouth: Oropharynx/mucosa pink, moist, clear.  Good dentition with some missing teeth.  Tongue midline. Neck: No JVD, no thyromegaly. Lungs: Clear bilaterally, somewhat diminished at the bases.  No labored breathing.  No cough Heart: RRR.  No MRG.  S1, S2 present Abdomen: Soft.  Active bowel sounds.  Not distended.  No tenderness.  No HSM, bruits, hernias, masses..   Rectal: Deferred.  Stool submitted for testing then last 24 hours was FOBT negative. Musc/Skeltl: No joint redness, swelling or gross deformities. Extremities: No CCE.  Feet are warm. Neurologic: Alert.  Oriented x3.  Moves all 4 limbs.  No tremor or asterixis. Skin: No rash, no sores, no telangiectasia Nodes: No cervical adenopathy Psych: Cooperative, calm.  Intake/Output from previous day: 12/07 0701 - 12/08 0700 In: 980 [I.V.:350; Blood:630] Out: 0  Intake/Output this shift: No intake/output data recorded.  LAB RESULTS: Recent Labs    06/01/20 1433 06/01/20 1630 06/02/20 0350  WBC 3.0* 2.8* 2.8*  HGB 5.4* 4.8* 6.8*  HCT 16.9* 14.7* 21.1*  PLT 158 139* 114*   BMET Lab Results  Component Value Date   NA 142 06/02/2020   NA 141 06/01/2020   K 3.2 (L) 06/02/2020   K 3.5 06/01/2020    CL 104 06/02/2020   CL 102 06/01/2020   CO2 27 06/02/2020   CO2 29 06/01/2020   GLUCOSE 90 06/02/2020   GLUCOSE 96 06/01/2020   BUN 9 06/02/2020   BUN 10 06/01/2020   CREATININE 0.54 06/02/2020   CREATININE 0.51 06/01/2020   CALCIUM 9.3 06/02/2020   CALCIUM 9.7 06/01/2020   LFT Recent Labs    06/01/20 1433 06/02/20 0350  PROT 6.9 5.8*  ALBUMIN 4.6 4.0  AST 127* 124*  ALT 35 33  ALKPHOS 42 36*  BILITOT 0.9 1.1   PT/INR Lab Results  Component Value Date   INR 1.2 06/02/2020   Hepatitis Panel No results for input(s): HEPBSAG, HCVAB, HEPAIGM, HEPBIGM in the last 72 hours. C-Diff No components found for: CDIFF Lipase  No results found for: LIPASE  Drugs of Abuse  No results found for: LABOPIA, COCAINSCRNUR, LABBENZ, AMPHETMU, THCU, LABBARB   RADIOLOGY STUDIES: CT ABDOMEN PELVIS W CONTRAST  Result Date: 06/01/2020 CLINICAL DATA:  Abdominal pain anemia EXAM: CT ABDOMEN AND PELVIS WITH CONTRAST TECHNIQUE: Multidetector CT imaging of the abdomen and pelvis was performed using the standard protocol following bolus administration of intravenous contrast. CONTRAST:  18mL OMNIPAQUE IOHEXOL 300 MG/ML  SOLN COMPARISON:  None. FINDINGS: Lower chest: The visualized heart size within normal limits. No pericardial fluid/thickening. No hiatal hernia. A trace bilateral pleural effusion is seen. Hepatobiliary: Tiny hypodense lesions are scattered throughout the liver the largest measuring 1.5 cm in the posterior right liver lobe. Main portal vein is patent. No evidence of calcified gallstones, gallbladder wall thickening or biliary dilatation. Pancreas: Unremarkable. No pancreatic ductal dilatation or surrounding inflammatory changes. Spleen: Normal in size without focal abnormality. Adrenals/Urinary Tract: Both adrenal glands appear normal. The kidneys and collecting system appear normal without evidence of urinary tract calculus or hydronephrosis. Bladder is  unremarkable. Stomach/Bowel:  The stomach, small bowel, and colon are normal in appearance. No inflammatory changes, wall thickening, or obstructive findings. A moderate amount of colonic stool is present. Vascular/Lymphatic: There are no enlarged mesenteric, retroperitoneal, or pelvic lymph nodes. No significant vascular findings are present. Reproductive: There is a large heterogeneously enhancing 9 cm rounded mass likely submucosal extending into the endometrial canal. There is scattered several other probable uterine fibroid seen along the posterior right fundus with partial calcifications. An IUD is seen displaced into the right in the endometrial canal. Other: No evidence of abdominal wall mass or hernia. Musculoskeletal: No acute or significant osseous findings. IMPRESSION: Large probable 9 cm submucosal intrauterine fibroid displacing the IUD. Several other partially calcified uterine fibroids. Moderate amount colonic stool without evidence of colitis. Trace bilateral pleural effusions. Electronically Signed   By: Prudencio Pair M.D.   On: 06/01/2020 20:33     IMPRESSION:   *    Macrocytic anemia.  Labs show low B12 level.  Pancytopenia.   3 units blood thus far w interim increase Hgb (after 2 PRBC) to 6.8.   sxs include dyspnea, weakness.    *   Recent GERC sxs w non-bloody emesis.    *    Isolated elevation AST. Hypodensities of liver on contrast CTAP.  *     Lack of general medical care.  *     Uterine fibroids.    PLAN:     *   Begin B12 supplementation.  Defer mgt to attending MD.    *    Check chronic hepatitis B and C serologies    Azucena Freed  06/02/2020, 8:44 AM Phone [phone]

## 2020-06-02 NOTE — Consult Note (Signed)
[facility] CONSULT NOTE  Patient Care Team: Patient, No Pcp Per as PCP - General (General Practice)   ASSESSMENT & PLAN:   Severe pancytopenia I have reviewed her peripheral blood smear Anisocytosis is seen with fragmented blood cells Platelet count is adequate without clumping Reduced white blood cell count noted without abnormal morphology Teardrop cells are noted  She is found to have severe vitamin B12 deficiency, either nutritionally related, malabsorption patient or increase consumption We discussed the risk and benefits of bone marrow aspirate and biopsy but the patient declined for now She is appropriately transfused I recommend close observation for the next 48 hours prior to discharge to ensure stability  Severe vitamin B12 deficiency I recommend daily vitamin B12 replacement therapy intramuscularly for 7 days She can continue high-dose vitamin B12 replacement therapy in the outpatient setting We discussed the risk and benefits of oral versus parenteral B12 replacement therapy and she is in agreement to proceed  Urinary tract infection I recommend 2 days of IV antibiotics If she her condition stabilized, she can receive oral antibiotics in the outpatient  Pelvic mass Recommend outpatient follow-up with gynecologist  Discharge planning We discussed the risk and benefits of bone marrow biopsy in future follow-up She would like to discuss this with her primary service I did offer her follow-up but the patient is concerned about her lack of insurance I reassured her that she will be seen in the outpatient clinic regardless of insurance status For now, I have not made outpatient follow-up I addressed all her questions and concerns and relayed the information to primary service  All questions were answered. The patient knows to call the clinic with any problems, questions or concerns.  Heath Lark, MD 12/8/20214:01 PM   CHIEF COMPLAINTS/PURPOSE OF  CONSULTATION:  Severe pancytopenia, pelvic mass  HISTORY OF PRESENTING ILLNESS:  Kelsey Gilmore 59 y.o. female is seen at the request by primary service for evaluation for severe pancytopenia  The patient is not working.  She lives at home by herself.  She has 1 daughter Her daughter is present in her room Starting around the summer, she started to have reduced stamina, dizziness, lightheadedness and shortness of breath on exertion The patient is not aware she has prior diagnosis of anemia. On review of her electronic records, her last CBC on October 10, 2013 showed normal hemoglobin of 12.2, platelet count of 215 and borderline low white blood cell count of 2.7 The patient stated she have no insurance or primary care doctor  She denies prior history of menorrhagia.  She have IUD placed for contraceptive purposes.  She cannot remember when she had her last menstrual cycle. She presented to the emergency department after feeling poorly with excessive fatigue and worsening shortness of breath  On admission, her CBC showed white count of 3.0, hemoglobin 5.4 and platelet count of 158,000.  That was subsequently repeated and confirmed persistent pancytopenia.  She received several units of blood transfusion and was admitted for further work-up Other pertinent test results include presence of abnormal urinalysis.  She was started on antibiotics for UTI.  She did have some nausea prior to admission  CT imaging of her abdomen showed Large probable 9 cm submucosal intrauterine fibroid displacing the IUD.  Several other partially calcified uterine fibroids.  Moderate amount colonic stool without evidence of colitis.  Trace bilateral pleural effusions.  MRI of her pelvis showed  1. Large central uterine mass with resulting displacement of the endometrium and an  intrauterine device to the right. Several other smaller uterine masses are present, some of which were calcified on CT. No aggressive  characteristics are demonstrated to suggest malignancy, and these findings are most consistent with uterine fibroids. However, fibroids are not reliably differentiated from leiomyosarcomas by imaging, and if that remains a clinical concern, surgical excision/hysterectomy should be considered. 2. Normal appearance of the ovaries. 3. Bone marrow reconversion pattern.  She had not noticed any recent bleeding such as epistaxis, hematuria or hematochezia The patient denies over the counter NSAID ingestion. She is not on antiplatelets agents.  She had no prior history or diagnosis of cancer. Her age appropriate screening programs are not up-to-date.  She cannot remember when she had a mammogram done.  She never have abnormal Pap smear She denies any pica and eats a variety of diet, although she stated she does not like to eat meat She never donated blood or received blood transfusion until present admission  Other pertinent work-up including low reticulocyte count, very low vitamin B12 level, no signs of iron deficiency and normal folate level.  She has no signs of renal failure on examination  MEDICAL HISTORY:  Past Medical History:  Diagnosis Date  . Anxiety    no meds  . Headache(784.0)    migraines occasional - last one 1 month ago - otc med prn  . SVD (spontaneous vaginal delivery)    x 1    SURGICAL HISTORY: Past Surgical History:  Procedure Laterality Date  . HYSTEROSCOPY WITH D & C N/A 10/10/2013   Procedure: DILATATION AND CURETTAGE /HYSTEROSCOPY; ;  Surgeon: Allyn Kenner, DO;  Location: [facility];  Service: Gynecology;  Laterality: N/A;  . SHOULDER ARTHROSCOPY     left    SOCIAL HISTORY: Social History   Socioeconomic History  . Marital status: Divorced    Spouse name: Not on file  . Number of children: Not on file  . Years of education: Not on file  . Highest education level: Not on file  Occupational History  . Not on file  Tobacco Use  . Smoking status: Never Smoker   . Smokeless tobacco: Never Used  Substance and Sexual Activity  . Alcohol use: Yes    Comment: ocaasional  . Drug use: No  . Sexual activity: Yes    Birth control/protection: Post-menopausal  Other Topics Concern  . Not on file  Social History Narrative  . Not on file   Social Determinants of Health   Financial Resource Strain:   . Difficulty of Paying Living Expenses: Not on file  Food Insecurity:   . Worried About Charity fundraiser in the Last Year: Not on file  . Ran Out of Food in the Last Year: Not on file  Transportation Needs:   . Lack of Transportation (Medical): Not on file  . Lack of Transportation (Non-Medical): Not on file  Physical Activity:   . Days of Exercise per Week: Not on file  . Minutes of Exercise per Session: Not on file  Stress:   . Feeling of Stress : Not on file  Social Connections:   . Frequency of Communication with Friends and Family: Not on file  . Frequency of Social Gatherings with Friends and Family: Not on file  . Attends Religious Services: Not on file  . Active Member of Clubs or Organizations: Not on file  . Attends Archivist Meetings: Not on file  . Marital Status: Not on file  Intimate Partner Violence:   .  Fear of Current or Ex-Partner: Not on file  . Emotionally Abused: Not on file  . Physically Abused: Not on file  . Sexually Abused: Not on file    FAMILY HISTORY: Family History  Problem Relation Age of Onset  . Colon cancer Mother     ALLERGIES:  has No Known Allergies.  MEDICATIONS:  Current Facility-Administered Medications  Medication Dose Route Frequency Provider Last Rate Last Admin  . 0.9 %  sodium chloride infusion (Manually program via Guardrails IV Fluids)   Intravenous Once Shalhoub, Sherryll Burger, MD      . 0.9 %  sodium chloride infusion  250 mL Intravenous PRN Bonnielee Haff, MD      . acetaminophen (TYLENOL) tablet 650 mg  650 mg Oral Q6H PRN Bonnielee Haff, MD       Or  . acetaminophen  (TYLENOL) suppository 650 mg  650 mg Rectal Q6H PRN Bonnielee Haff, MD      . cefTRIAXone (ROCEPHIN) 1 g in sodium chloride 0.9 % 100 mL IVPB  1 g Intravenous Q24H Bonnielee Haff, MD 200 mL/hr at 06/02/20 1436 1 g at 06/02/20 1436  . cyanocobalamin ((VITAMIN B-12)) injection 1,000 mcg  1,000 mcg Intramuscular Daily Bonnielee Haff, MD   1,000 mcg at 06/02/20 1444  . feeding supplement (ENSURE ENLIVE / ENSURE PLUS) liquid 237 mL  237 mL Oral BID BM Bonnielee Haff, MD      . multivitamin with minerals tablet 1 tablet  1 tablet Oral Daily Bonnielee Haff, MD      . ondansetron [facility]) tablet 4 mg  4 mg Oral Q6H PRN Bonnielee Haff, MD       Or  . ondansetron [facility]) injection 4 mg  4 mg Intravenous Q6H PRN Bonnielee Haff, MD      . pantoprazole (PROTONIX) EC tablet 40 mg  40 mg Oral Daily Gribbin, Sarah J, PA-C      . sodium chloride flush (NS) 0.9 % injection 3 mL  3 mL Intravenous Q12H Bonnielee Haff, MD      . sodium chloride flush (NS) 0.9 % injection 3 mL  3 mL Intravenous PRN Bonnielee Haff, MD        REVIEW OF SYSTEMS:   Constitutional: Denies fevers, chills or abnormal night sweats Eyes: Denies blurriness of vision, double vision or watery eyes Ears, nose, mouth, throat, and face: Denies mucositis or sore throat Gastrointestinal:  Denies nausea, heartburn or change in bowel habits Skin: Denies abnormal skin rashes Lymphatics: Denies new lymphadenopathy or easy bruising Neurological:Denies numbness, tingling or new weaknesses  Behavioral/Psych: Mood is stable, no new changes  All other systems were reviewed with the patient and are negative.  PHYSICAL EXAMINATION: ECOG PERFORMANCE STATUS: 1 - Symptomatic but completely ambulatory  Vitals:   06/02/20 0915 06/02/20 1003  BP: 130/71 (!) 103/91  Pulse: 69 66  Resp: 12 18  Temp: 98.2 F (36.8 C) 99.2 F (37.3 C)  SpO2: 100% 99%   Filed Weights   06/01/20 1616  Weight: 173 lb 15.1 oz (78.9 kg)    GENERAL:alert, no  distress and comfortable.  She looks thin and pale SKIN: skin color, texture, turgor are normal, no rashes or significant lesions EYES: normal, conjunctiva are pale and non-injected, sclera clear OROPHARYNX:no exudate, no erythema and lips, buccal mucosa, and tongue normal  NECK: supple, thyroid normal size, non-tender, without nodularity LYMPH:  no palpable lymphadenopathy in the cervical, axillary or inguinal LUNGS: clear to auscultation and percussion with normal breathing effort HEART: regular  rate & rhythm and no murmurs and no lower extremity edema ABDOMEN:abdomen soft, non-tender and normal bowel sounds.  Probable suprapubic mass on exam Musculoskeletal:no cyanosis of digits and no clubbing  PSYCH: alert & oriented x 3 with fluent speech NEURO: no focal motor/sensory deficits  RADIOGRAPHIC STUDIES: I have personally reviewed the radiological images as listed and agreed with the findings in the report. MR PELVIS W WO CONTRAST  Result Date: 06/02/2020 CLINICAL DATA:  Abdominal pain and anemia. Uterine fibroid suspected. Concern for leiomyosarcoma. EXAM: MRI PELVIS WITHOUT AND WITH CONTRAST TECHNIQUE: Multiplanar multisequence MR imaging of the pelvis was performed both before and after administration of intravenous contrast. CONTRAST:  7.65m GADAVIST GADOBUTROL 1 MMOL/ML IV SOLN COMPARISON:  Abdominopelvic CT 06/01/2020 FINDINGS: Urinary Tract: The visualized distal ureters and bladder appear unremarkable. Bowel: No bowel wall thickening, distention or surrounding inflammation identified within the pelvis. Vascular/Lymphatic: No enlarged pelvic lymph nodes identified. No significant vascular findings. Reproductive: Uterus: Measures 13.5 x 10.0 x 13.7 cm. As seen on recent CT, there is a large central uterine mass which measures 10.4 x 8.5 x 9.3 cm. This mass demonstrates mildly heterogeneous, predominately low T2 signal, homogeneous T1 signal and heterogeneous enhancement following contrast.  There is resulting displacement of the endometrium and an intrauterine device to the right. Several other smaller more peripheral uterine masses are present, some of which were calcified on CT. No extra uterine extension of these masses is seen. Endometrium: Distorted and displaced to the right. Intrauterine device in place. No focal uterine mass identified. Cervix/Vagina:  Appears normal. Right ovary:  Appears normal.  No mass identified. Left ovary:  Appears normal.  No mass identified. Other: Trace pelvic ascites, within physiologic limits. No peritoneal nodularity or suspicious enhancement. Musculoskeletal: Low T2 marrow signal identified within the bony pelvis and lumbar spine consistent with marrow reconversion related to anemia. No acute or focal osseous lesions identified. IMPRESSION: 1. Large central uterine mass with resulting displacement of the endometrium and an intrauterine device to the right. Several other smaller uterine masses are present, some of which were calcified on CT. No aggressive characteristics are demonstrated to suggest malignancy, and these findings are most consistent with uterine fibroids. However, fibroids are not reliably differentiated from leiomyosarcomas by imaging, and if that remains a clinical concern, surgical excision/hysterectomy should be considered. 2. Normal appearance of the ovaries. 3. Bone marrow reconversion pattern. Electronically Signed   By: WRichardean SaleM.D.   On: 06/02/2020 13:43   CT ABDOMEN PELVIS W CONTRAST  Result Date: 06/01/2020 CLINICAL DATA:  Abdominal pain anemia EXAM: CT ABDOMEN AND PELVIS WITH CONTRAST TECHNIQUE: Multidetector CT imaging of the abdomen and pelvis was performed using the standard protocol following bolus administration of intravenous contrast. CONTRAST:  1075mOMNIPAQUE IOHEXOL 300 MG/ML  SOLN COMPARISON:  None. FINDINGS: Lower chest: The visualized heart size within normal limits. No pericardial fluid/thickening. No hiatal  hernia. A trace bilateral pleural effusion is seen. Hepatobiliary: Tiny hypodense lesions are scattered throughout the liver the largest measuring 1.5 cm in the posterior right liver lobe. Main portal vein is patent. No evidence of calcified gallstones, gallbladder wall thickening or biliary dilatation. Pancreas: Unremarkable. No pancreatic ductal dilatation or surrounding inflammatory changes. Spleen: Normal in size without focal abnormality. Adrenals/Urinary Tract: Both adrenal glands appear normal. The kidneys and collecting system appear normal without evidence of urinary tract calculus or hydronephrosis. Bladder is unremarkable. Stomach/Bowel: The stomach, small bowel, and colon are normal in appearance. No inflammatory changes, wall thickening, or obstructive  findings. A moderate amount of colonic stool is present. Vascular/Lymphatic: There are no enlarged mesenteric, retroperitoneal, or pelvic lymph nodes. No significant vascular findings are present. Reproductive: There is a large heterogeneously enhancing 9 cm rounded mass likely submucosal extending into the endometrial canal. There is scattered several other probable uterine fibroid seen along the posterior right fundus with partial calcifications. An IUD is seen displaced into the right in the endometrial canal. Other: No evidence of abdominal wall mass or hernia. Musculoskeletal: No acute or significant osseous findings. IMPRESSION: Large probable 9 cm submucosal intrauterine fibroid displacing the IUD. Several other partially calcified uterine fibroids. Moderate amount colonic stool without evidence of colitis. Trace bilateral pleural effusions. Electronically Signed   By: Prudencio Pair M.D.   On: 06/01/2020 20:33        Heath Lark, MD @T @ 4:01 PM

## 2020-06-02 NOTE — ED Notes (Signed)
Date and time results received: 06/02/20 0516 (use smartphrase ".now" to insert current time)  Test: CBC Critical Value: Hgb 6.8  Name of Provider Notified: Shalhoub MD  Orders Received? Or Actions Taken?: will order another bag of blood for transfusion

## 2020-06-02 NOTE — Progress Notes (Signed)
Initial Nutrition Assessment  DOCUMENTATION CODES:   Not applicable  INTERVENTION:   -Continue Ensure Enlive po BID, each supplement provides 350 kcal and 20 grams of protein -MVI with minerals daily  NUTRITION DIAGNOSIS:   Inadequate oral intake related to decreased appetite as evidenced by per patient/family report.  GOAL:   Patient will meet greater than or equal to 90% of their needs  MONITOR:   Supplement acceptance, PO intake, Labs, Weight trends, Skin, I & O's  REASON FOR ASSESSMENT:   Malnutrition Screening Tool    ASSESSMENT:   This is a 59 year old African-American female with no significant past medical history who presents with a few week history of nausea, vomiting, abdominal pain, fatigue and is found to have severe anemia.  She has macrocytic anemia.  Etiology for anemia is not clear.  No acute bleeding has been noted.  Hemoccult is negative.  However there is a significant family history of colon cancer.  Other differentials include hemolytic anemia although patient does not have any other systemic symptoms and her bilirubin is noted to be normal.  Pt admitted with macrocytic anemia with significant symptoms and nausea and vomiting with symptoms of heartburn.   Reviewed I/O's: +980 ml x 24 hours  UOP: 0 ml x 24 hours  Attempted to speak with pt via call to hospital room phone, however, no answer. Per H&P, pt has not received routine medical care. Pt with no significant past medical history. Unable to obtain further nutrition-related history or complete nutrition-focused physical exam at this time.   No meal completion records documented at this time.   No weight history available at this time, however, pt endorses a 15 pound weight loss. No data to confirm this at this time.   Pt with Ensure Enlive ordered. Pt would benefit from addition of oral nutrition supplements.  Medications reviewed and include vitamin B-12.   Labs reviewed: K: 3.2.   Diet  Order:   Diet Order            Diet regular Room service appropriate? Yes; Fluid consistency: Thin  Diet effective now                 EDUCATION NEEDS:   Not appropriate for education at this time  Skin:  Skin Assessment: Reviewed RN Assessment  Last BM:  Unknown  Height:   Ht Readings from Last 1 Encounters:  06/01/20 5\' 8"  (1.727 m)    Weight:   Wt Readings from Last 1 Encounters:  06/01/20 78.9 kg    Ideal Body Weight:  70 kg  BMI:  Body mass index is 26.45 kg/m.  Estimated Nutritional Needs:   Kcal:  1950-2150  Protein:  100-115 grams  Fluid:  > 1.9 L    Loistine Chance, RD, LDN, [place] Registered Dietitian II Certified Diabetes Care and Education Specialist Please refer to [facility] for RD and/or RD on-call/weekend/after hours pager

## 2020-06-02 NOTE — Progress Notes (Addendum)
[facility] PROGRESS NOTE   Kelsey Gilmore XKG:[id number] DOB: 05-14-1961 DOA: 06/01/2020  PCP: Patient, No Pcp Per  Brief History/Interval Summary: 59 y.o. female with no significant past medical history who does not go to doctors for regular care.  She is a very poor historian.  Her daughter is at the bedside.  Apparently the patient has been feeling more fatigued than usual with shortness of breath with exertion over the past few weeks.  She also mentions other symptoms including nausea and vomiting.  She has had acid reflux over the past several weeks.  She thinks she may have lost some weight but unable to specifically quantify.  She thinks it may be 10 pounds.  Denies any blood in the stool, black-colored stool or blood in the urine or vaginal bleeding or spotting.  Denies any abdominal pain per se but her sister thinks that she may have had some abdominal discomfort over the last few weeks.  Once again patient is a very poor historian.  She went to the urgent care center and had blood work done which apparently revealed a hemoglobin of 5.5.  She was asked to go to the emergency department.  Blood work in the ED here also shows similar hemoglobin.  Patient has never had a colonoscopy.  There is family history of colon cancer.  Patient will be brought in for blood transfusion and may need endoscopy.  Reason for Visit: Severe anemia  Consultants:  Hematology: Dr. Alvy Bimler.   [facility].   Phone discussion with gynecology.  Procedures: None yet  Antibiotics: Anti-infectives (From admission, onward)   None      Subjective/Interval History: Patient denies any complaints.  Specifically no nausea vomiting.  Heartburn seems to be stable.  Denies any abdominal pain.  No diarrhea.  No bleeding from rectum or vaginal area.    Assessment/Plan:  Macrocytic anemia/vitamin B12 deficiency/pancytopenia Patient denies any history of bleeding.  She is noted to have a large  uterine fibroid but absolutely denies any vaginal bleeding.  Anemia could be due to severe vitamin B12 deficiency.  Ferritin noted to be 229.  Folate level 22.5.  Reticulocytes not elevated.  Discussed with hematology.  Her LDH is noted to be elevated which could be due to ineffective erythropoiesis.  Bilirubin is normal.  Patient will need high-dose B12 supplementation via injections for now.  Hematology will see the patient this afternoon.  Patient also noted to have leukopenia and thrombocytopenia which could all be due to B12 deficiency. Patient so far transfuse 3 units of PRBC.  Waiting on posttransfusion CBC after her last unit.  Uterine fibroid with displaced IUD Discussed both with Dr. Denman George GYN oncology as well as with Dr. Rogue Bussing patient's GYN previously.  Unclear as to how the patient got an IUD in her.  Dr. Rogue Bussing cannot find any records in her system regarding this.  Patient is not aware.  Dr. Denman George with GYN oncology does recommend MRI pelvis to make sure there is no evidence for leiomyosarcoma.  Otherwise no further testing or procedures recommended at this hospital stay.  She will need close outpatient follow-up with GYN.  Abnormal UA/UTI Patient mentioned passing dark urine. UA noted to be abnormal suggesting UTI. Will treat with IV abx for now. Urine C/s.  Elevated AST Unclear reason.  Patient denies any history of alcohol use.  Hepatitis panel is unremarkable.  Outpatient monitoring.  History of GERD/family history of colon cancer No obvious bowel abnormalities noted on CT scan.  Case was discussed with gastroenterology.  They have evaluated the patient.  No plan for inpatient colonoscopy or upper endoscopy at this time since no active bleeding.  DVT Prophylaxis: SCDs Code Status: Full code Family Communication: We will update her family. Discussed with the patient Disposition Plan: Hopefully return home when improved  Status is: Observation  The patient will require care  spanning > 2 midnights and should be moved to inpatient because: Unsafe d/c plan and IV treatments appropriate due to intensity of illness or inability to take PO  Dispo:  Patient From: Home  Planned Disposition: Home  Expected discharge date: 06/02/20  Medically stable for discharge: No     Medications:  Scheduled: . sodium chloride   Intravenous Once  . cyanocobalamin  1,000 mcg Intramuscular Daily  . pantoprazole  40 mg Oral Daily  . sodium chloride flush  3 mL Intravenous Q12H   Continuous: . sodium chloride     YHC:WCBJSE chloride, acetaminophen **OR** acetaminophen, ondansetron **OR** ondansetron (ZOFRAN) IV, sodium chloride flush   Objective:  Vital Signs  Vitals:   06/02/20 0745 06/02/20 0800 06/02/20 0915 06/02/20 1003  BP: (!) 119/55 125/65 130/71 (!) 103/91  Pulse: 61 61 69 66  Resp: 14 (!) 22 12 18   Temp:   98.2 F (36.8 C) 99.2 F (37.3 C)  TempSrc:   Oral Oral  SpO2: 100% 98% 100% 99%  Weight:      Height:        Intake/Output Summary (Last 24 hours) at 06/02/2020 1133 Last data filed at 06/02/2020 8315 Gross per 24 hour  Intake 1295 ml  Output 0 ml  Net 1295 ml   Filed Weights   06/01/20 1616  Weight: 78.9 kg    General appearance: Awake alert.  In no distress Resp: Clear to auscultation bilaterally.  Normal effort Cardio: S1-S2 is normal regular.  No S3-S4.  No rubs murmurs or bruit GI: Abdomen is soft.  Fullness appreciated in the suprapubic area likely due to the fibroid.  Otherwise nontender.  No masses organomegaly  Extremities: No edema.  Full range of motion of lower extremities. Neurologic:  No focal neurological deficits.    Lab Results:  Data Reviewed: I have personally reviewed following labs and imaging studies  CBC: Recent Labs  Lab 06/01/20 1433 06/01/20 1630 06/02/20 0350  WBC 3.0* 2.8* 2.8*  NEUTROABS  --  1.9  --   HGB 5.4* 4.8* 6.8*  HCT 16.9* 14.7* 21.1*  MCV 111.2* 109.7* 101.4*  PLT 158 139* 114*     Basic Metabolic Panel: Recent Labs  Lab 06/01/20 1433 06/02/20 0350  NA 141 142  K 3.5 3.2*  CL 102 104  CO2 29 27  GLUCOSE 96 90  BUN 10 9  CREATININE 0.51 0.54  CALCIUM 9.7 9.3  MG  --  2.2    GFR: Estimated Creatinine Clearance: 82.5 mL/min (by C-G formula based on SCr of 0.54 mg/dL).  Liver Function Tests: Recent Labs  Lab 06/01/20 1433 06/02/20 0350  AST 127* 124*  ALT 35 33  ALKPHOS 42 36*  BILITOT 0.9 1.1  PROT 6.9 5.8*  ALBUMIN 4.6 4.0     Coagulation Profile: Recent Labs  Lab 06/02/20 0350  INR 1.2    Thyroid Function Tests: Recent Labs    06/02/20 0350  TSH 3.740    Anemia Panel: Recent Labs    06/01/20 1623 06/01/20 1630  VITAMINB12  --  65*  FOLATE 22.5  --   FERRITIN  --  229  TIBC  --  238*  IRON  --  140  RETICCTPCT 1.3  --     Recent Results (from the past 240 hour(s))  Resp Panel by RT-PCR (Flu A&B, Covid) Nasopharyngeal Swab     Status: None   Collection Time: 06/01/20  4:30 PM   Specimen: Nasopharyngeal Swab; Nasopharyngeal(NP) swabs in vial transport medium  Result Value Ref Range Status   SARS Coronavirus 2 by RT PCR NEGATIVE NEGATIVE Final    Comment: (NOTE) SARS-CoV-2 target nucleic acids are NOT DETECTED.  The SARS-CoV-2 RNA is generally detectable in upper respiratory specimens during the acute phase of infection. The lowest concentration of SARS-CoV-2 viral copies this assay can detect is 138 copies/mL. A negative result does not preclude SARS-Cov-2 infection and should not be used as the sole basis for treatment or other patient management decisions. A negative result may occur with  improper specimen collection/handling, submission of specimen other than nasopharyngeal swab, presence of viral mutation(s) within the areas targeted by this assay, and inadequate number of viral copies(<138 copies/mL). A negative result must be combined with clinical observations, patient history, and  epidemiological information. The expected result is Negative.  Fact Sheet for Patients:  EntrepreneurPulse.com.au  Fact Sheet for Healthcare Providers:  IncredibleEmployment.be  This test is no t yet approved or cleared by the [place] FDA and  has been authorized for detection and/or diagnosis of SARS-CoV-2 by FDA under an Emergency Use Authorization (EUA). This EUA will remain  in effect (meaning this test can be used) for the duration of the COVID-19 declaration under Section 564(b)(1) of the Act, 21 U.S.C.section 360bbb-3(b)(1), unless the authorization is terminated  or revoked sooner.       Influenza A by PCR NEGATIVE NEGATIVE Final   Influenza B by PCR NEGATIVE NEGATIVE Final    Comment: (NOTE) The Xpert Xpress SARS-CoV-2/FLU/RSV plus assay is intended as an aid in the diagnosis of influenza from Nasopharyngeal swab specimens and should not be used as a sole basis for treatment. Nasal washings and aspirates are unacceptable for Xpert Xpress SARS-CoV-2/FLU/RSV testing.  Fact Sheet for Patients: EntrepreneurPulse.com.au  Fact Sheet for Healthcare Providers: IncredibleEmployment.be  This test is not yet approved or cleared by the [place] FDA and has been authorized for detection and/or diagnosis of SARS-CoV-2 by FDA under an Emergency Use Authorization (EUA). This EUA will remain in effect (meaning this test can be used) for the duration of the COVID-19 declaration under Section 564(b)(1) of the Act, 21 U.S.C. section 360bbb-3(b)(1), unless the authorization is terminated or revoked.  Performed at [facility], [place] [address], [place], [place] [address]       Radiology Studies: CT ABDOMEN PELVIS W CONTRAST  Result Date: 06/01/2020 CLINICAL DATA:  Abdominal pain anemia EXAM: CT ABDOMEN AND PELVIS WITH CONTRAST TECHNIQUE: Multidetector CT imaging of the abdomen and pelvis was  performed using the standard protocol following bolus administration of intravenous contrast. CONTRAST:  134mL OMNIPAQUE IOHEXOL 300 MG/ML  SOLN COMPARISON:  None. FINDINGS: Lower chest: The visualized heart size within normal limits. No pericardial fluid/thickening. No hiatal hernia. A trace bilateral pleural effusion is seen. Hepatobiliary: Tiny hypodense lesions are scattered throughout the liver the largest measuring 1.5 cm in the posterior right liver lobe. Main portal vein is patent. No evidence of calcified gallstones, gallbladder wall thickening or biliary dilatation. Pancreas: Unremarkable. No pancreatic ductal dilatation or surrounding inflammatory changes. Spleen: Normal in size without focal abnormality. Adrenals/Urinary Tract: Both adrenal glands appear  normal. The kidneys and collecting system appear normal without evidence of urinary tract calculus or hydronephrosis. Bladder is unremarkable. Stomach/Bowel: The stomach, small bowel, and colon are normal in appearance. No inflammatory changes, wall thickening, or obstructive findings. A moderate amount of colonic stool is present. Vascular/Lymphatic: There are no enlarged mesenteric, retroperitoneal, or pelvic lymph nodes. No significant vascular findings are present. Reproductive: There is a large heterogeneously enhancing 9 cm rounded mass likely submucosal extending into the endometrial canal. There is scattered several other probable uterine fibroid seen along the posterior right fundus with partial calcifications. An IUD is seen displaced into the right in the endometrial canal. Other: No evidence of abdominal wall mass or hernia. Musculoskeletal: No acute or significant osseous findings. IMPRESSION: Large probable 9 cm submucosal intrauterine fibroid displacing the IUD. Several other partially calcified uterine fibroids. Moderate amount colonic stool without evidence of colitis. Trace bilateral pleural effusions. Electronically Signed   By: Prudencio Pair M.D.   On: 06/01/2020 20:33       LOS: 0 days   [facility] Pager on [email or web address]  06/02/2020, 11:33 AM

## 2020-06-02 NOTE — Progress Notes (Signed)
HOSPITAL MEDICINE OVERNIGHT EVENT NOTE    Notified by nursing that hemoglobin status post 2 units packed red blood cell transfusion is still 6.8. Patient is not exhibiting any evidence of active bleeding at this time including no evidence of hematemesis, melena or bright red blood per rectum. Will order 1 additional unit of packed red blood cells followed by ordering a repeat hemoglobin and hematocrit status post transfusion.  Vernelle Emerald  MD [facility]

## 2020-06-02 NOTE — ED Notes (Signed)
Messaged Dr. Cyd Silence regarding pt.

## 2020-06-03 ENCOUNTER — Other Ambulatory Visit (HOSPITAL_COMMUNITY): Payer: Self-pay | Admitting: Internal Medicine

## 2020-06-03 DIAGNOSIS — D259 Leiomyoma of uterus, unspecified: Secondary | ICD-10-CM

## 2020-06-03 DIAGNOSIS — E538 Deficiency of other specified B group vitamins: Secondary | ICD-10-CM

## 2020-06-03 DIAGNOSIS — N390 Urinary tract infection, site not specified: Secondary | ICD-10-CM

## 2020-06-03 LAB — TYPE AND SCREEN
ABO/RH(D): A POS
Antibody Screen: NEGATIVE
Unit division: 0
Unit division: 0

## 2020-06-03 LAB — BPAM RBC
Blood Product Expiration Date: 202201012359
Blood Product Expiration Date: 202201052359
ISSUE DATE / TIME: 202112071659
ISSUE DATE / TIME: 202112080602
Unit Type and Rh: 6200
Unit Type and Rh: 6200

## 2020-06-03 LAB — CBC
HCT: 23.8 % — ABNORMAL LOW (ref 36.0–46.0)
Hemoglobin: 8.3 g/dL — ABNORMAL LOW (ref 12.0–15.0)
MCH: 33.7 pg (ref 26.0–34.0)
MCHC: 34.9 g/dL (ref 30.0–36.0)
MCV: 96.7 fL (ref 80.0–100.0)
Platelets: 101 10*3/uL — ABNORMAL LOW (ref 150–400)
RBC: 2.46 MIL/uL — ABNORMAL LOW (ref 3.87–5.11)
RDW: 26.5 % — ABNORMAL HIGH (ref 11.5–15.5)
WBC: 2.8 10*3/uL — ABNORMAL LOW (ref 4.0–10.5)
nRBC: 0.7 % — ABNORMAL HIGH (ref 0.0–0.2)

## 2020-06-03 LAB — COMPREHENSIVE METABOLIC PANEL
ALT: 31 U/L (ref 0–44)
AST: 103 U/L — ABNORMAL HIGH (ref 15–41)
Albumin: 4 g/dL (ref 3.5–5.0)
Alkaline Phosphatase: 34 U/L — ABNORMAL LOW (ref 38–126)
Anion gap: 11 (ref 5–15)
BUN: 10 mg/dL (ref 6–20)
CO2: 27 mmol/L (ref 22–32)
Calcium: 9.6 mg/dL (ref 8.9–10.3)
Chloride: 104 mmol/L (ref 98–111)
Creatinine, Ser: 0.51 mg/dL (ref 0.44–1.00)
GFR, Estimated: >60 mL/min (ref >60)
Glucose, Bld: 88 mg/dL (ref 70–99)
Potassium: 3.6 mmol/L (ref 3.5–5.1)
Sodium: 142 mmol/L (ref 135–145)
Total Bilirubin: 1.1 mg/dL (ref 0.3–1.2)
Total Protein: 5.9 g/dL — ABNORMAL LOW (ref 6.5–8.1)

## 2020-06-03 MED ORDER — SODIUM CHLORIDE 0.45 % IV SOLN: INTRAVENOUS | Status: AC

## 2020-06-03 MED ORDER — PANTOPRAZOLE SODIUM 40 MG PO TBEC
40.0000 mg | DELAYED_RELEASE_TABLET | Freq: Every day | ORAL | 0 refills | Status: DC
Start: 2020-06-04 — End: 2020-06-04

## 2020-06-03 MED ORDER — POTASSIUM CHLORIDE CRYS ER 20 MEQ PO TBCR
40.0000 meq | EXTENDED_RELEASE_TABLET | Freq: Once | ORAL | Status: AC
  Administered 2020-06-03: 40 meq via ORAL
  Filled 2020-06-03: qty 2

## 2020-06-03 MED ORDER — CYANOCOBALAMIN 1000 MCG/ML IJ SOLN: 1000.0000 ug | Freq: Every day | INTRAMUSCULAR | 0 refills | Status: DC

## 2020-06-03 MED ORDER — VITAMIN B-12 1000 MCG PO TABS: 1000.0000 ug | ORAL_TABLET | Freq: Every day | ORAL | 0 refills | Status: AC

## 2020-06-03 MED FILL — PANTOPRAZOLE SOD DR 40 MG T: 40 | 30 days supply | Qty: 30 | Fill #0

## 2020-06-03 MED FILL — VITAMIN B-12 1000 MCG TABS: 1000 | 30 days supply | Qty: 30 | Fill #0

## 2020-06-03 NOTE — TOC Initial Note (Addendum)
Transition of Care [facility]) - Initial/Assessment Note    Patient Details  Name: Kelsey Gilmore MRN: [id number] Date of Birth: Jun 23, 1961  Transition of Care [facility]) CM/SW Contact:    Curlene Labrum, RN Phone Number: 06/03/2020, 10:31 AM  Clinical Narrative:                 Case management met with the patient at the bedside regarding transitions of care to home.  The patient lives at home and has transportation.  The patient is needing IM daily injections of B12 and the patient states that her daughter, Lawanda Cousins, is willing to administer the injections to the patient at home.  The patient's family will need instructions on administration before she is discharged home.  The discharge medications can be sent through the [facility] pharmacy to deliver to the patient prior to discharge home.  The patient's PCP is Dr. Nicki Reaper with [facility].  Will continue to follow for discharge needs to home.  06/03/2020- TOC plans to deliver the patient's meds to room tomorrow and daughter to pay the 4.00 copay and give patient IM B12 injections at home.  Expected Discharge Plan: Home/Self Care Barriers to Discharge: Inadequate or no insurance   Patient Goals and CMS Choice Patient states their goals for this hospitalization and ongoing recovery are:: Patient would like to discharge home. CMS Medicare.gov Compare Post Acute Care list provided to:: Patient Choice offered to / list presented to : Patient  Expected Discharge Plan and Services Expected Discharge Plan: Home/Self Care   Discharge Planning Services: CM Consult   Living arrangements for the past 2 months: Single Family Home                                      Prior Living Arrangements/Services Living arrangements for the past 2 months: Single Family Home Lives with:: Self Patient language and need for interpreter reviewed:: Yes Do you feel safe going back to the place where you live?: Yes      Need for Family  Participation in Patient Care: Yes (Comment) Care giver support system in place?: Yes (comment)   Criminal Activity/Legal Involvement Pertinent to Current Situation/Hospitalization: No - Comment as needed  Activities of Daily Living Home Assistive Devices/Equipment: None ADL Screening (condition at time of admission) Patient's cognitive ability adequate to safely complete daily activities?: Yes Is the patient deaf or have difficulty hearing?: No Does the patient have difficulty seeing, even when wearing glasses/contacts?: No Does the patient have difficulty concentrating, remembering, or making decisions?: Yes Patient able to express need for assistance with ADLs?: Yes Does the patient have difficulty dressing or bathing?: No Independently performs ADLs?: Yes (appropriate for developmental age) Does the patient have difficulty walking or climbing stairs?: Yes Weakness of Legs: Both Weakness of Arms/Hands: None  Permission Sought/Granted Permission sought to share information with : Case Manager Permission granted to share information with : Yes, Verbal Permission Granted     Permission granted to share info w AGENCY: [facility] pharmacy  Permission granted to share info w Relationship: daughter, Keilani Terrance - [phone]     Emotional Assessment Appearance:: Appears stated age Attitude/Demeanor/Rapport: Gracious Affect (typically observed): Accepting Orientation: : Oriented to Self,Oriented to Place,Oriented to  Time,Oriented to Situation Alcohol / Substance Use: Not Applicable Psych Involvement: No (comment)  Admission diagnosis:  Symptomatic anemia [D64.9] Patient Active Problem List   Diagnosis Date Noted  .  Symptomatic anemia 06/01/2020  . Macrocytic anemia 06/01/2020  . GERD (gastroesophageal reflux disease) 06/01/2020  . Nausea and vomiting 06/01/2020   PCP:  Patient, No Pcp Per Pharmacy:   [id number] [place], [place] [place]Phone: [phone]: [phone] [address] [place] [address]Phone: [phone]: [phone]    Social Determinants of Health (SDOH) Interventions    Readmission Risk Interventions Readmission Risk Prevention Plan 06/03/2020  Post Dischage Appt Complete  Medication Screening Complete  Transportation Screening Complete  Some recent data might be hidden

## 2020-06-03 NOTE — Progress Notes (Signed)
[facility] PROGRESS NOTE   Kelsey Gilmore:[id number] DOB: Nov 21, 1960 DOA: 06/01/2020  PCP: Patient, No Pcp Per  Brief History/Interval Summary: 59 y.o. female with no significant past medical history who does not go to doctors for regular care.  She is a very poor historian.  Her daughter is at the bedside.  Apparently the patient has been feeling more fatigued than usual with shortness of breath with exertion over the past few weeks.  She also mentions other symptoms including nausea and vomiting.  She has had acid reflux over the past several weeks.  She thinks she may have lost some weight but unable to specifically quantify.  She thinks it may be 10 pounds.  Denies any blood in the stool, black-colored stool or blood in the urine or vaginal bleeding or spotting.  Denies any abdominal pain per se but her sister thinks that she may have had some abdominal discomfort over the last few weeks.  Once again patient is a very poor historian.  She went to the urgent care center and had blood work done which apparently revealed a hemoglobin of 5.5.  She was asked to go to the emergency department.  Blood work in the ED here also shows similar hemoglobin.  Patient has never had a colonoscopy.  There is family history of colon cancer.  Patient will be brought in for blood transfusion and may need endoscopy.   Consultants:  Hematology: Dr. Alvy Bimler.   [facility].   Phone discussion with gynecology.  Procedures: None yet  Antibiotics: Anti-infectives (From admission, onward)   Start     Dose/Rate Route Frequency Ordered Stop   06/02/20 1230  cefTRIAXone (ROCEPHIN) 1 g in sodium chloride 0.9 % 100 mL IVPB        1 g 200 mL/hr over 30 Minutes Intravenous Every 24 hours 06/02/20 1142        Subjective/Interval History: Patient denies any bleeding.  Continues to have dark urine.  Denies any dysuria per se but again history is has been very limited from this patient.  Denies  any abdominal pain.  No nausea vomiting.     Assessment/Plan:  Macrocytic anemia/vitamin B12 deficiency/pancytopenia Patient denies any history of bleeding.  She is noted to have a large uterine fibroid but denies any vaginal bleeding.  Anemia could be due to severe vitamin B12 deficiency.  Ferritin noted to be 229.  Folate level 22.5.  Reticulocytes not elevated. Her LDH is noted to be elevated which could be due to ineffective erythropoiesis.  Bilirubin is normal.  By hematology.  Patient started on high-dose vitamin B12 supplementation.  Patient also noted to have leukopenia and thrombocytopenia which could be due to B12 deficiency. Patient so far has been transfused 3 units of PRBC.  Hemoglobin is stable.  No evidence of overt bleeding. We will need to see if we can arrange for parenteral administration of B12 injection in the outpatient setting.  Will involve TOC to assist.  After 7 days of parenteral B12 she can be transitioned over to oral B12.   Uterine fibroid with displaced IUD Discussed both with Dr. Denman George GYN oncology as well as with Dr. Rogue Bussing patient's GYN previously.  Unclear as to how the patient got an IUD in her.  Dr. Rogue Bussing cannot find any records in her system regarding this.  Patient is not aware.   MRI pelvis was recommended by Dr. Denman George with GYN oncology.  This was done yesterday and continues to show uterine fibroids  without any aggressive features.  No further work-up at this time.  Outpatient follow-up with Dr. Rogue Bussing.    Abnormal UA/UTI Patient mentioned passing dark urine. UA noted to be abnormal suggesting UTI.  Currently on ceftriaxone.  Follow-up urine cultures.  Elevated AST Unclear reason.  Patient denies any history of alcohol use.  Hepatitis panel is unremarkable.  Outpatient monitoring.  History of GERD/family history of colon cancer No obvious bowel abnormalities noted on CT scan.  Case was discussed with gastroenterology.  They have evaluated the  patient.  No plan for inpatient colonoscopy or upper endoscopy at this time since no active bleeding.  Follow-up in the outpatient setting.  DVT Prophylaxis: SCDs Code Status: Full code Family Communication: Discussed with the patient.  Daughter will be updated today.  Patient requested on call her sister, Lorrin Goodell Disposition Plan: Need to arrange a way for her to get vitamin B12 injections after discharge.  Status is: Inpatient  Remains inpatient appropriate because:Unsafe d/c plan, IV treatments appropriate due to intensity of illness or inability to take PO and Inpatient level of care appropriate due to severity of illness   Dispo:  Patient From: Home  Planned Disposition: Home  Expected discharge date: 06/02/2020  Medically stable for discharge: No      Medications:  Scheduled: . sodium chloride   Intravenous Once  . cyanocobalamin  1,000 mcg Intramuscular Daily  . feeding supplement  237 mL Oral BID BM  . multivitamin with minerals  1 tablet Oral Daily  . pantoprazole  40 mg Oral Daily  . sodium chloride flush  3 mL Intravenous Q12H   Continuous: . sodium chloride    . cefTRIAXone (ROCEPHIN)  IV 1 g (06/02/20 1436)   LXB:WIOMBT chloride, acetaminophen **OR** acetaminophen, ondansetron **OR** ondansetron (ZOFRAN) IV, sodium chloride flush   Objective:  Vital Signs  Vitals:   06/02/20 1619 06/02/20 1931 06/03/20 0328 06/03/20 0840  BP: 115/72 135/72 120/71 136/84  Pulse: 63 (!) 59 61 63  Resp: 18 18 16 18   Temp: 98.9 F (37.2 C) 98.8 F (37.1 C) 98.4 F (36.9 C) 98.6 F (37 C)  TempSrc: Oral Oral Oral Oral  SpO2: 99%  100% 99%  Weight:      Height:        Intake/Output Summary (Last 24 hours) at 06/03/2020 0912 Last data filed at 06/03/2020 0300 Gross per 24 hour  Intake 1019 ml  Output 900 ml  Net 119 ml   Filed Weights   06/01/20 1616  Weight: 78.9 kg    General appearance: Awake alert.  In no distress Resp: Clear to auscultation  bilaterally.  Normal effort Cardio: S1-S2 is normal regular.  No S3-S4.  No rubs murmurs or bruit GI: Abdomen is soft.  Fullness appreciated in the suprapubic area.  Nontender.  No masses organomegaly Extremities: No edema.  Full range of motion of lower extremities. Neurologic:  No focal neurological deficits.     Lab Results:  Data Reviewed: I have personally reviewed following labs and imaging studies  CBC: Recent Labs  Lab 06/01/20 1433 06/01/20 1630 06/02/20 0350 06/02/20 1350 06/03/20 0334  WBC 3.0* 2.8* 2.8*  --  2.8*  NEUTROABS  --  1.9  --   --   --   HGB 5.4* 4.8* 6.8* 8.9* 8.3*  HCT 16.9* 14.7* 21.1* 26.6* 23.8*  MCV 111.2* 109.7* 101.4*  --  96.7  PLT 158 139* 114*  --  101*    Basic Metabolic Panel: Recent Labs  Lab 06/01/20 1433 06/02/20 0350 06/03/20 0334  NA 141 142 142  K 3.5 3.2* 3.6  CL 102 104 104  CO2 29 27 27   GLUCOSE 96 90 88  BUN 10 9 10   CREATININE 0.51 0.54 0.51  CALCIUM 9.7 9.3 9.6  MG  --  2.2  --     GFR: Estimated Creatinine Clearance: 82.5 mL/min (by C-G formula based on SCr of 0.51 mg/dL).  Liver Function Tests: Recent Labs  Lab 06/01/20 1433 06/02/20 0350 06/03/20 0334  AST 127* 124* 103*  ALT 35 33 31  ALKPHOS 42 36* 34*  BILITOT 0.9 1.1 1.1  PROT 6.9 5.8* 5.9*  ALBUMIN 4.6 4.0 4.0     Coagulation Profile: Recent Labs  Lab 06/02/20 0350  INR 1.2    Thyroid Function Tests: Recent Labs    06/02/20 0350  TSH 3.740    Anemia Panel: Recent Labs    06/01/20 1623 06/01/20 1630  VITAMINB12  --  65*  FOLATE 22.5  --   FERRITIN  --  229  TIBC  --  238*  IRON  --  140  RETICCTPCT 1.3  --     Recent Results (from the past 240 hour(s))  Resp Panel by RT-PCR (Flu A&B, Covid) Nasopharyngeal Swab     Status: None   Collection Time: 06/01/20  4:30 PM   Specimen: Nasopharyngeal Swab; Nasopharyngeal(NP) swabs in vial transport medium  Result Value Ref Range Status   SARS Coronavirus 2 by RT PCR NEGATIVE  NEGATIVE Final    Comment: (NOTE) SARS-CoV-2 target nucleic acids are NOT DETECTED.  The SARS-CoV-2 RNA is generally detectable in upper respiratory specimens during the acute phase of infection. The lowest concentration of SARS-CoV-2 viral copies this assay can detect is 138 copies/mL. A negative result does not preclude SARS-Cov-2 infection and should not be used as the sole basis for treatment or other patient management decisions. A negative result may occur with  improper specimen collection/handling, submission of specimen other than nasopharyngeal swab, presence of viral mutation(s) within the areas targeted by this assay, and inadequate number of viral copies(<138 copies/mL). A negative result must be combined with clinical observations, patient history, and epidemiological information. The expected result is Negative.  Fact Sheet for Patients:  EntrepreneurPulse.com.au  Fact Sheet for Healthcare Providers:  IncredibleEmployment.be  This test is no t yet approved or cleared by the [place] FDA and  has been authorized for detection and/or diagnosis of SARS-CoV-2 by FDA under an Emergency Use Authorization (EUA). This EUA will remain  in effect (meaning this test can be used) for the duration of the COVID-19 declaration under Section 564(b)(1) of the Act, 21 U.S.C.section 360bbb-3(b)(1), unless the authorization is terminated  or revoked sooner.       Influenza A by PCR NEGATIVE NEGATIVE Final   Influenza B by PCR NEGATIVE NEGATIVE Final    Comment: (NOTE) The Xpert Xpress SARS-CoV-2/FLU/RSV plus assay is intended as an aid in the diagnosis of influenza from Nasopharyngeal swab specimens and should not be used as a sole basis for treatment. Nasal washings and aspirates are unacceptable for Xpert Xpress SARS-CoV-2/FLU/RSV testing.  Fact Sheet for Patients: EntrepreneurPulse.com.au  Fact Sheet for Healthcare  Providers: IncredibleEmployment.be  This test is not yet approved or cleared by the [place] FDA and has been authorized for detection and/or diagnosis of SARS-CoV-2 by FDA under an Emergency Use Authorization (EUA). This EUA will remain in effect (meaning this test can be used) for the duration  of the COVID-19 declaration under Section 564(b)(1) of the Act, 21 U.S.C. section 360bbb-3(b)(1), unless the authorization is terminated or revoked.  Performed at [facility], [place] [address], [place], [place] [address]       Radiology Studies: MR PELVIS W WO CONTRAST  Result Date: 06/02/2020 CLINICAL DATA:  Abdominal pain and anemia. Uterine fibroid suspected. Concern for leiomyosarcoma. EXAM: MRI PELVIS WITHOUT AND WITH CONTRAST TECHNIQUE: Multiplanar multisequence MR imaging of the pelvis was performed both before and after administration of intravenous contrast. CONTRAST:  7.76mL GADAVIST GADOBUTROL 1 MMOL/ML IV SOLN COMPARISON:  Abdominopelvic CT 06/01/2020 FINDINGS: Urinary Tract: The visualized distal ureters and bladder appear unremarkable. Bowel: No bowel wall thickening, distention or surrounding inflammation identified within the pelvis. Vascular/Lymphatic: No enlarged pelvic lymph nodes identified. No significant vascular findings. Reproductive: Uterus: Measures 13.5 x 10.0 x 13.7 cm. As seen on recent CT, there is a large central uterine mass which measures 10.4 x 8.5 x 9.3 cm. This mass demonstrates mildly heterogeneous, predominately low T2 signal, homogeneous T1 signal and heterogeneous enhancement following contrast. There is resulting displacement of the endometrium and an intrauterine device to the right. Several other smaller more peripheral uterine masses are present, some of which were calcified on CT. No extra uterine extension of these masses is seen. Endometrium: Distorted and displaced to the right. Intrauterine device in place. No focal uterine mass  identified. Cervix/Vagina:  Appears normal. Right ovary:  Appears normal.  No mass identified. Left ovary:  Appears normal.  No mass identified. Other: Trace pelvic ascites, within physiologic limits. No peritoneal nodularity or suspicious enhancement. Musculoskeletal: Low T2 marrow signal identified within the bony pelvis and lumbar spine consistent with marrow reconversion related to anemia. No acute or focal osseous lesions identified. IMPRESSION: 1. Large central uterine mass with resulting displacement of the endometrium and an intrauterine device to the right. Several other smaller uterine masses are present, some of which were calcified on CT. No aggressive characteristics are demonstrated to suggest malignancy, and these findings are most consistent with uterine fibroids. However, fibroids are not reliably differentiated from leiomyosarcomas by imaging, and if that remains a clinical concern, surgical excision/hysterectomy should be considered. 2. Normal appearance of the ovaries. 3. Bone marrow reconversion pattern. Electronically Signed   By: Richardean Sale M.D.   On: 06/02/2020 13:43   CT ABDOMEN PELVIS W CONTRAST  Result Date: 06/01/2020 CLINICAL DATA:  Abdominal pain anemia EXAM: CT ABDOMEN AND PELVIS WITH CONTRAST TECHNIQUE: Multidetector CT imaging of the abdomen and pelvis was performed using the standard protocol following bolus administration of intravenous contrast. CONTRAST:  112mL OMNIPAQUE IOHEXOL 300 MG/ML  SOLN COMPARISON:  None. FINDINGS: Lower chest: The visualized heart size within normal limits. No pericardial fluid/thickening. No hiatal hernia. A trace bilateral pleural effusion is seen. Hepatobiliary: Tiny hypodense lesions are scattered throughout the liver the largest measuring 1.5 cm in the posterior right liver lobe. Main portal vein is patent. No evidence of calcified gallstones, gallbladder wall thickening or biliary dilatation. Pancreas: Unremarkable. No pancreatic ductal  dilatation or surrounding inflammatory changes. Spleen: Normal in size without focal abnormality. Adrenals/Urinary Tract: Both adrenal glands appear normal. The kidneys and collecting system appear normal without evidence of urinary tract calculus or hydronephrosis. Bladder is unremarkable. Stomach/Bowel: The stomach, small bowel, and colon are normal in appearance. No inflammatory changes, wall thickening, or obstructive findings. A moderate amount of colonic stool is present. Vascular/Lymphatic: There are no enlarged mesenteric, retroperitoneal, or pelvic lymph nodes. No significant vascular  findings are present. Reproductive: There is a large heterogeneously enhancing 9 cm rounded mass likely submucosal extending into the endometrial canal. There is scattered several other probable uterine fibroid seen along the posterior right fundus with partial calcifications. An IUD is seen displaced into the right in the endometrial canal. Other: No evidence of abdominal wall mass or hernia. Musculoskeletal: No acute or significant osseous findings. IMPRESSION: Large probable 9 cm submucosal intrauterine fibroid displacing the IUD. Several other partially calcified uterine fibroids. Moderate amount colonic stool without evidence of colitis. Trace bilateral pleural effusions. Electronically Signed   By: Prudencio Pair M.D.   On: 06/01/2020 20:33       LOS: 1 day   [facility] Pager on [email or web address]  06/03/2020, 9:12 AM

## 2020-06-03 NOTE — Progress Notes (Signed)
Nutrition Follow-up  DOCUMENTATION CODES:   Not applicable  INTERVENTION:   -Continue MVI with minerals daily -Continue Ensure Enlive po BID, each supplement provides 350 kcal and 20 grams of protein  NUTRITION DIAGNOSIS:   Inadequate oral intake related to decreased appetite as evidenced by per patient/family report.  [place]  GOAL:   Patient will meet greater than or equal to 90% of their needs  Progressing   MONITOR:   Supplement acceptance,PO intake,Labs,Weight trends,Skin,I & O's  REASON FOR ASSESSMENT:   Malnutrition Screening Tool    ASSESSMENT:   This is a 59 year old African-American female with no significant past medical history who presents with a few week history of nausea, vomiting, abdominal pain, fatigue and is found to have severe anemia.  She has macrocytic anemia.  Etiology for anemia is not clear.  No acute bleeding has been noted.  Hemoccult is negative.  However there is a significant family history of colon cancer.  Other differentials include hemolytic anemia although patient does not have any other systemic symptoms and her bilirubin is noted to be normal.  Reviewed I/O's: +119 ml x 24 hours and +1.1 L since admission  UOP: 900 ml x 24 hours  Spoke with pt at bedside, who reports that she is feeling better today. PTA she reports feeling weak over the past 5-6 months. 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 However, pt reports that she now feels very fatigued and often has to rest to complete her chores. She also shares that she has been sleeping more often as usual.   PTA pt was consuming one large meal per day (meat, starch, and vegetable). She would also graze on fruit and snack items throughout the day.   Pt is unsure of UBW. She thinks she has lost weight, as her clothes have gotten looser and estimates  she has lost 1-2 dress sizes.   Observed lunch meal- pt consumed about 50% of burger and fries. Discussed with pt importance of good meal and supplement intake to promote healing.   Per TOC notes, plan to likely discharge tomorrow, once B12 injections can be arranged.   Medications reviewed and include 0.45% sodium chloride infusion @ 75 ml/hr.   Labs reviewed.   NUTRITION - FOCUSED PHYSICAL EXAM:  Flowsheet Row Most Recent Value  Orbital Region No depletion  Upper Arm Region Mild depletion  Thoracic and Lumbar Region No depletion  Buccal Region No depletion  Temple Region No depletion  Clavicle Bone Region No depletion  Clavicle and Acromion Bone Region No depletion  Scapular Bone Region No depletion  Dorsal Hand No depletion  Patellar Region No depletion  Anterior Thigh Region No depletion  Posterior Calf Region No depletion  Edema (RD Assessment) None  Hair Reviewed  Eyes Reviewed  Mouth Reviewed  Skin Reviewed  Nails Reviewed       Diet Order:   Diet Order            Diet regular Room service appropriate? Yes; Fluid consistency: Thin  Diet effective now                 EDUCATION NEEDS:   Not appropriate for education at this time  Skin:  Skin Assessment: Reviewed RN Assessment  Last BM:  05/31/20  Height:   Ht Readings from Last 1 Encounters:  06/01/20 5\' 8"  (1.727  m)    Weight:   Wt Readings from Last 1 Encounters:  06/01/20 78.9 kg    Ideal Body Weight:  70 kg  BMI:  Body mass index is 26.45 kg/m.  Estimated Nutritional Needs:   Kcal:  1950-2150  Protein:  100-115 grams  Fluid:  > 1.9 L    Loistine Chance, RD, LDN, [place] Registered Dietitian II Certified Diabetes Care and Education Specialist Please refer to [facility] for RD and/or RD on-call/weekend/after hours pager

## 2020-06-04 ENCOUNTER — Other Ambulatory Visit (HOSPITAL_COMMUNITY): Payer: Self-pay | Admitting: Internal Medicine

## 2020-06-04 LAB — COMPREHENSIVE METABOLIC PANEL
ALT: 29 U/L (ref 0–44)
AST: 64 U/L — ABNORMAL HIGH (ref 15–41)
Albumin: 4.1 g/dL (ref 3.5–5.0)
Alkaline Phosphatase: 33 U/L — ABNORMAL LOW (ref 38–126)
Anion gap: 9 (ref 5–15)
BUN: 6 mg/dL (ref 6–20)
CO2: 27 mmol/L (ref 22–32)
Calcium: 9.5 mg/dL (ref 8.9–10.3)
Chloride: 105 mmol/L (ref 98–111)
Creatinine, Ser: 0.52 mg/dL (ref 0.44–1.00)
GFR, Estimated: >60 mL/min (ref >60)
Glucose, Bld: 99 mg/dL (ref 70–99)
Potassium: 3.8 mmol/L (ref 3.5–5.1)
Sodium: 141 mmol/L (ref 135–145)
Total Bilirubin: 1 mg/dL (ref 0.3–1.2)
Total Protein: 6.1 g/dL — ABNORMAL LOW (ref 6.5–8.1)

## 2020-06-04 LAB — CBC
HCT: 25.2 % — ABNORMAL LOW (ref 36.0–46.0)
Hemoglobin: 8 g/dL — ABNORMAL LOW (ref 12.0–15.0)
MCH: 31.4 pg (ref 26.0–34.0)
MCHC: 31.7 g/dL (ref 30.0–36.0)
MCV: 98.8 fL (ref 80.0–100.0)
Platelets: 93 10*3/uL — ABNORMAL LOW (ref 150–400)
RBC: 2.55 MIL/uL — ABNORMAL LOW (ref 3.87–5.11)
RDW: 25.7 % — ABNORMAL HIGH (ref 11.5–15.5)
WBC: 2.6 10*3/uL — ABNORMAL LOW (ref 4.0–10.5)
nRBC: 0 % (ref 0.0–0.2)

## 2020-06-04 LAB — URINE CULTURE: Culture: >=100000 — AB

## 2020-06-04 LAB — HAPTOGLOBIN: Haptoglobin: <10 mg/dL — ABNORMAL LOW (ref 33–346)

## 2020-06-04 MED ORDER — ADULT MULTIVITAMIN W/MINERALS CH: 1.0000 | ORAL_TABLET | Freq: Every day | ORAL | Status: AC

## 2020-06-04 MED ORDER — CEPHALEXIN 500 MG PO CAPS
500.0000 mg | ORAL_CAPSULE | Freq: Three times a day (TID) | ORAL | Status: DC
  Administered 2020-06-04: 500 mg via ORAL
  Filled 2020-06-04: qty 1

## 2020-06-04 MED ORDER — CEPHALEXIN 500 MG PO CAPS: 500.0000 mg | ORAL_CAPSULE | Freq: Three times a day (TID) | ORAL | 0 refills | Status: DC

## 2020-06-04 MED FILL — CEPHALEXIN 500 MG CAPS: 500 | 4 days supply | Qty: 12 | Fill #0

## 2020-06-04 MED FILL — CYANOCOBALAMIN 1000 MCG/ML: 1000 | 4 days supply | Qty: 4 | Fill #0

## 2020-06-04 NOTE — Discharge Instructions (Signed)
Vitamin B12 Deficiency Vitamin B12 deficiency means that your body does not have enough vitamin B12. The body needs this vitamin:  To make red blood cells.  To make genes (DNA).  To help the nerves work. If you do not have enough vitamin B12 in your body, you can have health problems. What are the causes?  Not eating enough foods that contain vitamin B12.  Not being able to absorb vitamin B12 from the food that you eat.  Certain digestive system diseases.  A condition in which the body does not make enough of a certain protein, which results in too few red blood cells (pernicious anemia).  Having a surgery in which part of the stomach or small intestine is removed.  Taking medicines that make it hard for the body to absorb vitamin B12. These medicines include: ? Heartburn medicines. ? Some antibiotic medicines. ? Other medicines that are used to treat certain conditions. What increases the risk?  Being older than age 8.  Eating a vegetarian or vegan diet, especially while you are pregnant.  Eating a poor diet while you are pregnant.  Taking certain medicines.  Having alcoholism. What are the signs or symptoms? In some cases, there are no symptoms. If the condition leads to too few blood cells or nerve damage, symptoms can occur, such as:  Feeling weak.  Feeling tired (fatigued).  Not being hungry.  Weight loss.  A loss of feeling (numbness) or tingling in your hands and feet.  Redness and burning of the tongue.  Being mixed up (confused) or having memory problems.  Sadness (depression).  Problems with your senses. This can include color blindness, ringing in the ears, or loss of taste.  Watery poop (diarrhea) or trouble pooping (constipation).  Trouble walking. If anemia is very bad, symptoms can include:  Being short of breath.  Being dizzy.  Having a very fast heartbeat. How is this treated?  Changing the way you eat and drink, such  as: ? Eating more foods that contain vitamin B12. ? Drinking little or no alcohol.  Getting vitamin B12 shots.  Taking vitamin B12 supplements. Your doctor will tell you the dose that is best for you. Follow these instructions at home: Eating and drinking   Eat lots of healthy foods that contain vitamin B12. These include: ? Meats and poultry, such as beef, pork, chicken, [place], and organ meats, such as liver. ? Seafood, such as clams, rainbow trout, salmon, tuna, and haddock. ? Eggs. ? Cereal and dairy products that have vitamin B12 added to them. Check the label. The items listed above may not be a complete list of what you can eat and drink. Contact a dietitian for more options. General instructions  Get any shots as told by your doctor.  Take supplements only as told by your doctor.  Do not drink alcohol if your doctor tells you not to. In some cases, you may only be asked to limit alcohol use.  Keep all follow-up visits as told by your doctor. This is important. Contact a doctor if:  Your symptoms come back. Get help right away if:  You have trouble breathing.  You have a very fast heartbeat.  You have chest pain.  You get dizzy.  You pass out. Summary  Vitamin B12 deficiency means that your body is not getting enough vitamin B12.  In some cases, there are no symptoms of this condition.  Treatment may include making a change in the way you eat and drink,  getting vitamin B12 shots, or taking supplements.  Eat lots of healthy foods that contain vitamin B12. This information is not intended to replace advice given to you by your health care provider. Make sure you discuss any questions you have with your health care provider. Document Revised: 02/19/2018 Document Reviewed: 02/19/2018 Else[employer] Patient Education  [place].   Cyanocobalamin, Vitamin B12 injection What is this medicine? CYANOCOBALAMIN (sye an oh koe BAL a min) is a man made form of  vitamin B12. Vitamin B12 is used in the growth of healthy blood cells, nerve cells, and proteins in the body. It also helps with the metabolism of fats and carbohydrates. This medicine is used to treat people who can not absorb vitamin B12. This medicine may be used for other purposes; ask your health care provider or pharmacist if you have questions. COMMON BRAND NAME(S): B-12 Compliance Kit, B-12 Injection Kit, Cyomin, LA-12, Nutri-Twelve, Physicians EZ Use B-12, Primabalt What should I tell my health care provider before I take this medicine? They need to know if you have any of these conditions:  kidney disease  Leber's disease  megaloblastic anemia  an unusual or allergic reaction to cyanocobalamin, cobalt, other medicines, foods, dyes, or preservatives  pregnant or trying to get pregnant  breast-feeding How should I use this medicine? This medicine is injected into a muscle or deeply under the skin. It is usually given by a health care professional in a clinic or doctor's office. However, your doctor may teach you how to inject yourself. Follow all instructions. Talk to your pediatrician regarding the use of this medicine in children. Special care may be needed. Overdosage: If you think you have taken too much of this medicine contact a poison control center or emergency room at once. NOTE: This medicine is only for you. Do not share this medicine with others. What if I miss a dose? If you are given your dose at a clinic or doctor's office, call to reschedule your appointment. If you give your own injections and you miss a dose, take it as soon as you can. If it is almost time for your next dose, take only that dose. Do not take double or extra doses. What may interact with this medicine?  colchicine  heavy alcohol intake This list may not describe all possible interactions. Give your health care provider a list of all the medicines, herbs, non-prescription drugs, or dietary  supplements you use. Also tell them if you smoke, drink alcohol, or use illegal drugs. Some items may interact with your medicine. What should I watch for while using this medicine? Visit your doctor or health care professional regularly. You may need blood work done while you are taking this medicine. You may need to follow a special diet. Talk to your doctor. Limit your alcohol intake and avoid smoking to get the best benefit. What side effects may I notice from receiving this medicine? Side effects that you should report to your doctor or health care professional as soon as possible:  allergic reactions like skin rash, itching or hives, swelling of the face, lips, or tongue  blue tint to skin  chest tightness, pain  difficulty breathing, wheezing  dizziness  red, swollen painful area on the leg Side effects that usually do not require medical attention (report to your doctor or health care professional if they continue or are bothersome):  diarrhea  headache This list may not describe all possible side effects. Call your doctor for medical advice  about side effects. You may report side effects to FDA at 1-800-FDA-1088. Where should I keep my medicine? Keep out of the reach of children. Store at room temperature between 15 and 30 degrees C (59 and 85 degrees F). Protect from light. Throw away any unused medicine after the expiration date. NOTE: This sheet is a summary. It may not cover all possible information. If you have questions about this medicine, talk to your doctor, pharmacist, or health care provider.  2020 Elsevier/Gold Standard (2007-09-23 22:10:20)

## 2020-06-04 NOTE — Plan of Care (Signed)
  Problem: Education: Goal: Knowledge of General Education information will improve Description Including pain rating scale, medication(s)/side effects and non-pharmacologic comfort measures Outcome: Progressing   

## 2020-06-04 NOTE — Discharge Summary (Signed)
[facility]  Physician Discharge Summary   Patient ID: Kelsey Gilmore MRN: [id number] DOB/AGE: 08/21/1960 59 y.o.  Admit date: 06/01/2020 Discharge date: 06/05/2020  PCP: Patient, No Pcp Per  DISCHARGE DIAGNOSES:  Vitamin B12 deficiency Pancytopenia Macrocytosis Uterine fibroid with displaced IUD UTI Elevated AST History of GERD  RECOMMENDATIONS FOR OUTPATIENT FOLLOW UP: 1. Patient to follow-up with Dr. Alvy Bimler with hematology who will arrange outpatient appointment for her 2. Patient and daughter know that she has follow-up with her GYN, Dr. Rogue Bussing    Home Health: None Equipment/Devices: None  CODE STATUS: Full code  DISCHARGE CONDITION: fair  Diet recommendation: Regular as tolerated  INITIAL HISTORY: 59 y.o.femalewith no significant past medical history who does not goto Information systems manager care. She is a very poor historian. Her daughter is at the bedside. Apparently the patient has been feeling more fatigued than usual with shortness of breath with exertion over the past few weeks. She also mentions othersymptoms including nausea and vomiting. She has had acid reflux over the past several weeks. She thinks she may have lost some weight but unable to specifically quantify. She thinks it may be 10 pounds. Denies any blood in the stool,black-colored stool or blood in the urine or vaginal bleeding or spotting. Denies any abdominal pain per se but her sister thinks that she may have had some abdominal discomfort over the last few weeks. Once again patient is a very poor historian.  She went to the urgent care center and had blood work done which apparently revealed a hemoglobin of 5.5. She was asked to go to the emergency department. Blood work in the ED here also shows similar hemoglobin. Patient has never had a colonoscopy. There is family history of colon cancer. Patient will be brought in for blood transfusion and may need  endoscopy.   Consultations: Hematology: Dr. Alvy Bimler.   [facility].   Phone discussion with gynecology.  Procedures:  None during this admission   HOSPITAL COURSE:   Macrocytic anemia/vitamin B12 deficiency/pancytopenia Patient denies any history of bleeding.  She is noted to have a large uterine fibroid but denies any vaginal bleeding.  Anemia could be due to severe vitamin B12 deficiency.  Ferritin noted to be 229.  Folate level 22.5.  Reticulocytes not elevated. Her LDH is noted to be elevated which could be due to ineffective erythropoiesis.  Bilirubin is normal.  Patient was seen by hematology, Dr. Alvy Bimler.  Patient started on high-dose vitamin B12 supplementation.  Patient also noted to have leukopenia and thrombocytopenia which could be due to B12 deficiency. Hematology did offer bone marrow biopsy to the patient however she declined. Patient so far has been transfused 3 units of PRBC.  Hemoglobin is stable.  No evidence of overt bleeding. Patient will need 4 more days of B12 injections.  Her daughter has been educated to inject these.  She is comfortable doing so.  Hematology will follow patient in their office.  After 7 days of injection so she will take oral vitamin B12.  Uterine fibroid with displaced IUD Discussed both with Dr. Denman George GYN oncology as well as with Dr. Rogue Bussing patient's GYN previously.  Unclear as to how the patient got an IUD in her.  Dr. Rogue Bussing cannot find any records in her system regarding this.  Patient is not aware.   MRI pelvis was recommended by Dr. Denman George with GYN oncology.  This was done yesterday and continues to show uterine fibroids without any aggressive features.  No further work-up at  this time.  Outpatient follow-up with Dr. Rogue Bussing.    Acute UTI with Klebsiella Patient mentioned passing dark urine with some discomfort.  UA was noted to be abnormal.  Urine culture positive for Klebsiella.  Initially treated with ceftriaxone and then  transitioned to Keflex.  Symptoms improved.  Elevated AST Unclear reason.  Patient denies any history of alcohol use.  Hepatitis panel is unremarkable.  Outpatient monitoring.  History of GERD/family history of colon cancer No obvious bowel abnormalities noted on CT scan.  Case was discussed with gastroenterology.  They have evaluated the patient.  No plan for inpatient colonoscopy or upper endoscopy at this time since no active bleeding.  Follow-up in the outpatient setting.  PPI.  Overall she is stable.  Has been ambulating without difficulty.  Discussed with hematology.  Okay for discharge today.    PERTINENT LABS:  The results of significant diagnostics from this hospitalization (including imaging, microbiology, ancillary and laboratory) are listed below for reference.    Microbiology: Recent Results (from the past 240 hour(s))  Resp Panel by RT-PCR (Flu A&B, Covid) Nasopharyngeal Swab     Status: None   Collection Time: 06/01/20  4:30 PM   Specimen: Nasopharyngeal Swab; Nasopharyngeal(NP) swabs in vial transport medium  Result Value Ref Range Status   SARS Coronavirus 2 by RT PCR NEGATIVE NEGATIVE Final    Comment: (NOTE) SARS-CoV-2 target nucleic acids are NOT DETECTED.  The SARS-CoV-2 RNA is generally detectable in upper respiratory specimens during the acute phase of infection. The lowest concentration of SARS-CoV-2 viral copies this assay can detect is 138 copies/mL. A negative result does not preclude SARS-Cov-2 infection and should not be used as the sole basis for treatment or other patient management decisions. A negative result may occur with  improper specimen collection/handling, submission of specimen other than nasopharyngeal swab, presence of viral mutation(s) within the areas targeted by this assay, and inadequate number of viral copies(<138 copies/mL). A negative result must be combined with clinical observations, patient history, and  epidemiological information. The expected result is Negative.  Fact Sheet for Patients:  EntrepreneurPulse.com.au  Fact Sheet for Healthcare Providers:  IncredibleEmployment.be  This test is no t yet approved or cleared by the [place] FDA and  has been authorized for detection and/or diagnosis of SARS-CoV-2 by FDA under an Emergency Use Authorization (EUA). This EUA will remain  in effect (meaning this test can be used) for the duration of the COVID-19 declaration under Section 564(b)(1) of the Act, 21 U.S.C.section 360bbb-3(b)(1), unless the authorization is terminated  or revoked sooner.       Influenza A by PCR NEGATIVE NEGATIVE Final   Influenza B by PCR NEGATIVE NEGATIVE Final    Comment: (NOTE) The Xpert Xpress SARS-CoV-2/FLU/RSV plus assay is intended as an aid in the diagnosis of influenza from Nasopharyngeal swab specimens and should not be used as a sole basis for treatment. Nasal washings and aspirates are unacceptable for Xpert Xpress SARS-CoV-2/FLU/RSV testing.  Fact Sheet for Patients: EntrepreneurPulse.com.au  Fact Sheet for Healthcare Providers: IncredibleEmployment.be  This test is not yet approved or cleared by the [place] FDA and has been authorized for detection and/or diagnosis of SARS-CoV-2 by FDA under an Emergency Use Authorization (EUA). This EUA will remain in effect (meaning this test can be used) for the duration of the COVID-19 declaration under Section 564(b)(1) of the Act, 21 U.S.C. section 360bbb-3(b)(1), unless the authorization is terminated or revoked.  Performed at [facility], [place]  [address] [place], [place] [address]   Culture, Urine     Status: Abnormal   Collection Time: 06/02/20 11:43 AM   Specimen: Urine, Random  Result Value Ref Range Status   Specimen Description URINE, RANDOM  Final   Special Requests   Final    NONE Performed at  [facility], [place] [address], [place], [place] [address]    Culture >=100,000 COLONIES/mL KLEBSIELLA PNEUMONIAE (A)  Final   Report Status 06/04/2020 FINAL  Final   Organism ID, Bacteria KLEBSIELLA PNEUMONIAE (A)  Final      Susceptibility   Klebsiella pneumoniae - MIC*    AMPICILLIN >=32 RESISTANT Resistant     CEFAZOLIN <=4 SENSITIVE Sensitive     CEFEPIME <=0.12 SENSITIVE Sensitive     CEFTRIAXONE <=0.25 SENSITIVE Sensitive     CIPROFLOXACIN <=0.25 SENSITIVE Sensitive     GENTAMICIN <=1 SENSITIVE Sensitive     IMIPENEM 1 SENSITIVE Sensitive     NITROFURANTOIN <=16 SENSITIVE Sensitive     TRIMETH/SULFA <=20 SENSITIVE Sensitive     AMPICILLIN/SULBACTAM 4 SENSITIVE Sensitive     PIP/TAZO <=4 SENSITIVE Sensitive     * >=100,000 COLONIES/mL KLEBSIELLA PNEUMONIAE     Labs:  COVID-19 Labs   Lab Results  Component Value Date   [place] NEGATIVE 06/01/2020      Basic Metabolic Panel: Recent Labs  Lab 06/01/20 1433 06/02/20 0350 06/03/20 0334 06/04/20 0344  NA 141 142 142 141  K 3.5 3.2* 3.6 3.8  CL 102 104 104 105  CO2 29 27 27 27   GLUCOSE 96 90 88 99  BUN 10 9 10 6   CREATININE 0.51 0.54 0.51 0.52  CALCIUM 9.7 9.3 9.6 9.5  MG  --  2.2  --   --    Liver Function Tests: Recent Labs  Lab 06/01/20 1433 06/02/20 0350 06/03/20 0334 06/04/20 0344  AST 127* 124* 103* 64*  ALT 35 33 31 29  ALKPHOS 42 36* 34* 33*  BILITOT 0.9 1.1 1.1 1.0  PROT 6.9 5.8* 5.9* 6.1*  ALBUMIN 4.6 4.0 4.0 4.1   CBC: Recent Labs  Lab 06/01/20 1433 06/01/20 1630 06/02/20 0350 06/02/20 1350 06/03/20 0334 06/04/20 0344  WBC 3.0* 2.8* 2.8*  --  2.8* 2.6*  NEUTROABS  --  1.9  --   --   --   --   HGB 5.4* 4.8* 6.8* 8.9* 8.3* 8.0*  HCT 16.9* 14.7* 21.1* 26.6* 23.8* 25.2*  MCV 111.2* 109.7* 101.4*  --  96.7 98.8  PLT 158 139* 114*  --  101* 93*     IMAGING STUDIES MR PELVIS W WO CONTRAST  Result Date: 06/02/2020 CLINICAL DATA:  Abdominal pain and anemia. Uterine fibroid  suspected. Concern for leiomyosarcoma. EXAM: MRI PELVIS WITHOUT AND WITH CONTRAST TECHNIQUE: Multiplanar multisequence MR imaging of the pelvis was performed both before and after administration of intravenous contrast. CONTRAST:  7.55m GADAVIST GADOBUTROL 1 MMOL/ML IV SOLN COMPARISON:  Abdominopelvic CT 06/01/2020 FINDINGS: Urinary Tract: The visualized distal ureters and bladder appear unremarkable. Bowel: No bowel wall thickening, distention or surrounding inflammation identified within the pelvis. Vascular/Lymphatic: No enlarged pelvic lymph nodes identified. No significant vascular findings. Reproductive: Uterus: Measures 13.5 x 10.0 x 13.7 cm. As seen on recent CT, there is a large central uterine mass which measures 10.4 x 8.5 x 9.3 cm. This mass demonstrates mildly heterogeneous, predominately low T2 signal, homogeneous T1 signal and heterogeneous enhancement following contrast. There is resulting displacement of the endometrium and an intrauterine device to  the right. Several other smaller more peripheral uterine masses are present, some of which were calcified on CT. No extra uterine extension of these masses is seen. Endometrium: Distorted and displaced to the right. Intrauterine device in place. No focal uterine mass identified. Cervix/Vagina:  Appears normal. Right ovary:  Appears normal.  No mass identified. Left ovary:  Appears normal.  No mass identified. Other: Trace pelvic ascites, within physiologic limits. No peritoneal nodularity or suspicious enhancement. Musculoskeletal: Low T2 marrow signal identified within the bony pelvis and lumbar spine consistent with marrow reconversion related to anemia. No acute or focal osseous lesions identified. IMPRESSION: 1. Large central uterine mass with resulting displacement of the endometrium and an intrauterine device to the right. Several other smaller uterine masses are present, some of which were calcified on CT. No aggressive characteristics are  demonstrated to suggest malignancy, and these findings are most consistent with uterine fibroids. However, fibroids are not reliably differentiated from leiomyosarcomas by imaging, and if that remains a clinical concern, surgical excision/hysterectomy should be considered. 2. Normal appearance of the ovaries. 3. Bone marrow reconversion pattern. Electronically Signed   By: Richardean Sale M.D.   On: 06/02/2020 13:43   CT ABDOMEN PELVIS W CONTRAST  Result Date: 06/01/2020 CLINICAL DATA:  Abdominal pain anemia EXAM: CT ABDOMEN AND PELVIS WITH CONTRAST TECHNIQUE: Multidetector CT imaging of the abdomen and pelvis was performed using the standard protocol following bolus administration of intravenous contrast. CONTRAST:  166m OMNIPAQUE IOHEXOL 300 MG/ML  SOLN COMPARISON:  None. FINDINGS: Lower chest: The visualized heart size within normal limits. No pericardial fluid/thickening. No hiatal hernia. A trace bilateral pleural effusion is seen. Hepatobiliary: Tiny hypodense lesions are scattered throughout the liver the largest measuring 1.5 cm in the posterior right liver lobe. Main portal vein is patent. No evidence of calcified gallstones, gallbladder wall thickening or biliary dilatation. Pancreas: Unremarkable. No pancreatic ductal dilatation or surrounding inflammatory changes. Spleen: Normal in size without focal abnormality. Adrenals/Urinary Tract: Both adrenal glands appear normal. The kidneys and collecting system appear normal without evidence of urinary tract calculus or hydronephrosis. Bladder is unremarkable. Stomach/Bowel: The stomach, small bowel, and colon are normal in appearance. No inflammatory changes, wall thickening, or obstructive findings. A moderate amount of colonic stool is present. Vascular/Lymphatic: There are no enlarged mesenteric, retroperitoneal, or pelvic lymph nodes. No significant vascular findings are present. Reproductive: There is a large heterogeneously enhancing 9 cm rounded  mass likely submucosal extending into the endometrial canal. There is scattered several other probable uterine fibroid seen along the posterior right fundus with partial calcifications. An IUD is seen displaced into the right in the endometrial canal. Other: No evidence of abdominal wall mass or hernia. Musculoskeletal: No acute or significant osseous findings. IMPRESSION: Large probable 9 cm submucosal intrauterine fibroid displacing the IUD. Several other partially calcified uterine fibroids. Moderate amount colonic stool without evidence of colitis. Trace bilateral pleural effusions. Electronically Signed   By: BPrudencio PairM.D.   On: 06/01/2020 20:33    DISCHARGE EXAMINATION: Vitals:   06/03/20 2105 06/04/20 0326 06/04/20 0901 06/04/20 1419  BP: 119/61 131/69 127/81 (!) 116/55  Pulse: 62 (!) 56 68 66  Resp: 16 16 20 20   Temp: 98.7 F (37.1 C) 99 F (37.2 C) 98.6 F (37 C) 99.4 F (37.4 C)  TempSrc: Oral Oral Oral Oral  SpO2: 100% 100% 100% 100%  Weight:      Height:       General appearance: Awake alert.  In no distress  Resp: Clear to auscultation bilaterally.  Normal effort Cardio: S1-S2 is normal regular.  No S3-S4.  No rubs murmurs or bruit GI: Abdomen is soft.  Nontender nondistended.  Bowel sounds are present normal.  No masses organomegaly    DISPOSITION: Home  Discharge Instructions    Call MD for:  difficulty breathing, headache or visual disturbances   Complete by: As directed    Call MD for:  extreme fatigue   Complete by: As directed    Call MD for:  persistant dizziness or light-headedness   Complete by: As directed    Call MD for:  persistant nausea and vomiting   Complete by: As directed    Call MD for:  severe uncontrolled pain   Complete by: As directed    Call MD for:  temperature >100.4   Complete by: As directed    Diet general   Complete by: As directed    Discharge instructions   Complete by: As directed    You will hear from Dr. Calton Dach office  to schedule an appointment.  Please be sure to follow-up with Dr. Rogue Bussing for your uterine fibroids and the IUD that is in your uterus.  Please do not miss your appointments.  Please be sure to take your medications as prescribed.  Seek attention immediately if your symptoms of fatigue and shortness of breath were to recur. You will also need to discuss with your primary care provider regarding undergoing colonoscopy in the near future.  You were cared for by a hospitalist during your hospital stay. If you have any questions about your discharge medications or the care you received while you were in the hospital after you are discharged, you can call the unit and asked to speak with the hospitalist on call if the hospitalist that took care of you is not available. Once you are discharged, your primary care physician will handle any further medical issues. Please note that NO REFILLS for any discharge medications will be authorized once you are discharged, as it is imperative that you return to your primary care physician (or establish a relationship with a primary care physician if you do not have one) for your aftercare needs so that they can reassess your need for medications and monitor your lab values. If you do not have a primary care physician, you can call [phone] for a physician referral.   Increase activity slowly   Complete by: As directed         Allergies as of 06/04/2020   No Known Allergies     Medication List    STOP taking these medications   oxyCODONE-acetaminophen 5-325 MG tablet Commonly known as: Roxicet     TAKE these medications   cephALEXin 500 MG capsule Commonly known as: KEFLEX Take 1 capsule (500 mg total) by mouth every 8 (eight) hours for 4 days.   cyanocobalamin 1000 MCG/ML injection Commonly known as: (VITAMIN B-12) Inject 1 mL (1,000 mcg total) into the muscle daily for 4 days.   vitamin B-12 1000 MCG tablet Commonly known as: CYANOCOBALAMIN Take 1  tablet (1,000 mcg total) by mouth daily. Start taking on: June 09, 2020   multivitamin with minerals Tabs tablet Take 1 tablet by mouth daily.   pantoprazole 40 MG tablet Commonly known as: PROTONIX Take 1 tablet (40 mg total) by mouth daily.         Follow-up Information    [facility]. Schedule an appointment as soon as possible for a visit.  Why: Please follow up with your primary care for repeat blood work and hospital follow up. Contact information: [place], [place] [address]  [phone]       Allyn Kenner, DO. Schedule an appointment as soon as possible for a visit in 2 week(s).   Specialty: Obstetrics and Gynecology Why: for uterine fibroids Contact information: [place] [address] [phone]        Heath Lark, MD Follow up.   Specialty: Hematology and Oncology Why: her office will call to schedule appointment Contact information: [place] [address] Kite: 34 minutes  [place]  [facility] Pager on [email or web address]  06/05/2020, 11:47 AM

## 2020-06-04 NOTE — Progress Notes (Signed)
Discharge package printed and instructions given to patient and daughter. Educate daughter how to give B12 IM. Daughter gave today B12 IM to patient in present of RN . Daughter verbalizes understand instructions and  confident of doing it. IM Instructions also  printed and given to patient/daughter.

## 2020-06-07 ENCOUNTER — Other Ambulatory Visit: Payer: Self-pay | Admitting: Hematology and Oncology

## 2020-06-07 DIAGNOSIS — D539 Nutritional anemia, unspecified: Secondary | ICD-10-CM | POA: Insufficient documentation

## 2020-06-07 DIAGNOSIS — D649 Anemia, unspecified: Secondary | ICD-10-CM

## 2020-06-14 ENCOUNTER — Inpatient Hospital Stay: Payer: Self-pay

## 2020-06-14 ENCOUNTER — Inpatient Hospital Stay: Payer: Self-pay | Attending: Hematology and Oncology | Admitting: Hematology and Oncology

## 2020-06-14 ENCOUNTER — Encounter: Payer: Self-pay | Admitting: Hematology and Oncology

## 2020-09-16 ENCOUNTER — Telehealth: Payer: Self-pay | Admitting: Hematology and Oncology

## 2020-09-16 NOTE — Telephone Encounter (Signed)
I cld Ms. Frutoso Chase to schedule an appt w/Dr. Alvy Bimler.I lft the pt a vm to call me back.

## 2020-09-20 ENCOUNTER — Telehealth: Payer: Self-pay | Admitting: Hematology and Oncology

## 2020-09-20 NOTE — Telephone Encounter (Signed)
Ms. Fotheringham returned my call and has been scheduled to see Dr. Alvy Bimler on 4/4 at 1pm w/labs at 1230pm. Pt aware to arrive 20 minutes early.

## 2020-09-27 ENCOUNTER — Other Ambulatory Visit: Payer: Self-pay

## 2020-09-27 ENCOUNTER — Inpatient Hospital Stay: Payer: Self-pay | Attending: Hematology and Oncology

## 2020-09-27 ENCOUNTER — Inpatient Hospital Stay: Payer: Self-pay | Admitting: Hematology and Oncology

## 2020-09-27 DIAGNOSIS — D539 Nutritional anemia, unspecified: Secondary | ICD-10-CM

## 2020-09-27 DIAGNOSIS — D649 Anemia, unspecified: Secondary | ICD-10-CM

## 2020-09-27 NOTE — Progress Notes (Signed)
Patient present to clinic today for a New Patient visit with MD.   Pt requested for clarification with financial assistance prior to seeing MD.  Pt was given "orange card" financial access.   Practice Administer spoke with patient, provided resources for financial resources.   Offered for patient to be seen as self pay today, patient declined.  Patient declined additional resources that were offered as well.

## 2022-02-10 IMAGING — MR MR PELVIS WO/W CM
16 of 22 series · 31 of 48 positions shown · IV contrast (gadavist)
Comparison: Abdominopelvic CT 06/01/2020

CLINICAL DATA: Abdominal pain and anemia. Uterine fibroid
suspected. Concern for leiomyosarcoma.

EXAM:
MRI PELVIS WITHOUT AND WITH CONTRAST
TECHNIQUE: Multiplanar multisequence MR imaging of the pelvis was performed
both before and after administration of intravenous contrast.
CONTRAST:  7.5mL GADAVIST GADOBUTROL 1 MMOL/ML IV SOLN

[Series 3: T2 · coronal · 5.0mm · 1.09mm/px · 1 of 33 slices shown]
[im 1/33]
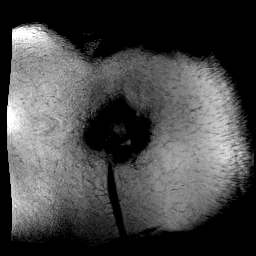

[Series 4: ax tse trig · axial · 5.0mm · 0.75mm/px · 1 of 37 slices shown]
[im 1/37]
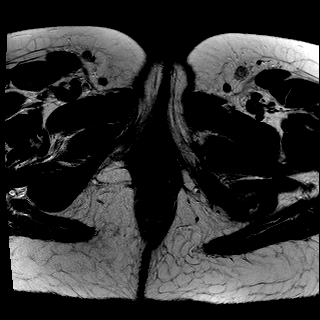

[Series 5: ax tse fs · axial · 5.0mm · 0.75mm/px · 1 of 37 slices shown]
[im 1/37]
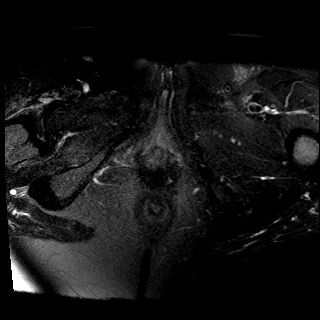

[Series 6: sag tse trig · sagittal · 5.0mm · 0.75mm/px · 1 of 33 slices shown (1 of 2)]
[im 1/33]
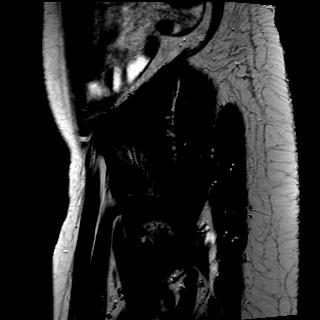

[Series 7: t2_blade_tra · axial · 5.0mm · 0.94mm/px · 1 of 37 slices shown]
[im 1/37]
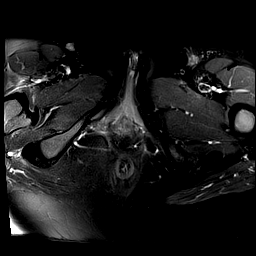

[Series 8: sag tse trig · sagittal · 5.0mm · 0.75mm/px · 1 of 33 slices shown (2 of 2)]
[im 1/33]
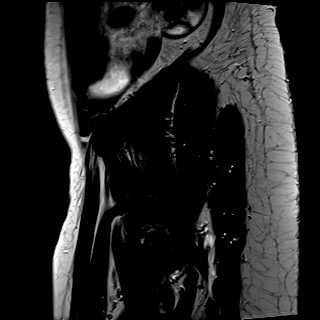

[Series 9: T1 dynamic · axial · 3.0mm · 0.78mm/px · z∈[-56,+157]mm · 2 of 72 slices shown (1 of 4)]
[im 1/72]
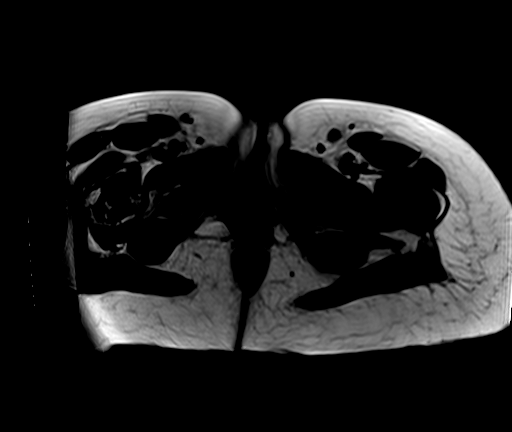
[im 72/72]
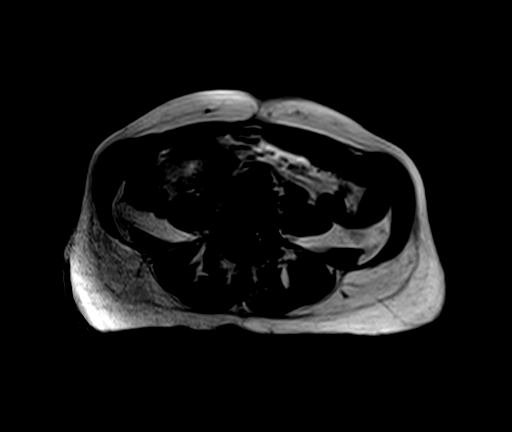

[Series 10: T1 dynamic · axial · 3.0mm · 0.78mm/px · z∈[-56,+157]mm · 2 of 72 slices shown (2 of 4)]
[im 1/72]
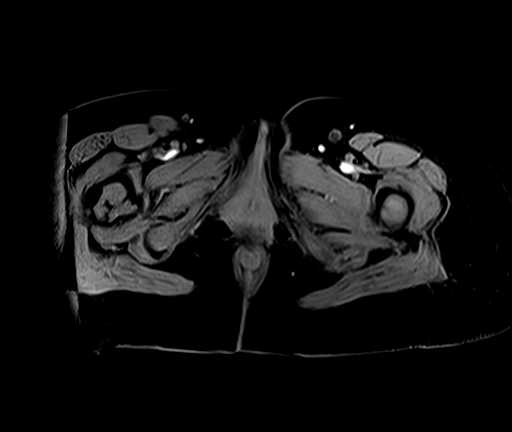
[im 72/72]
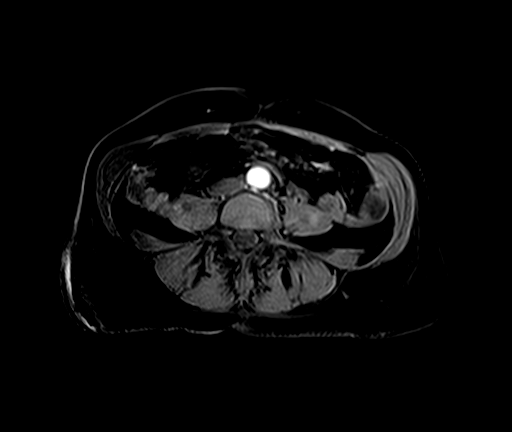

[Series 11: T1 dynamic · axial · 3.0mm · 0.59mm/px · z∈[-58,+155]mm · 3 of 72 slices shown (3 of 4)]
[im 1/72]
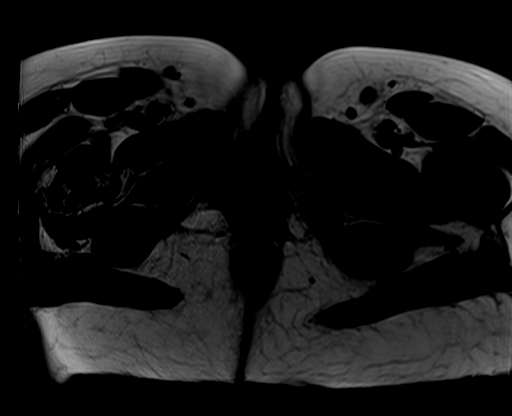
[im 36/72]
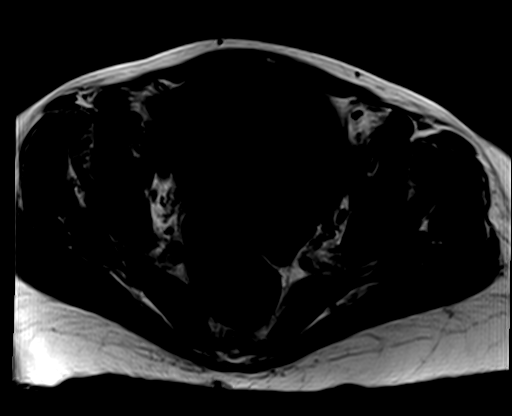
[im 72/72]
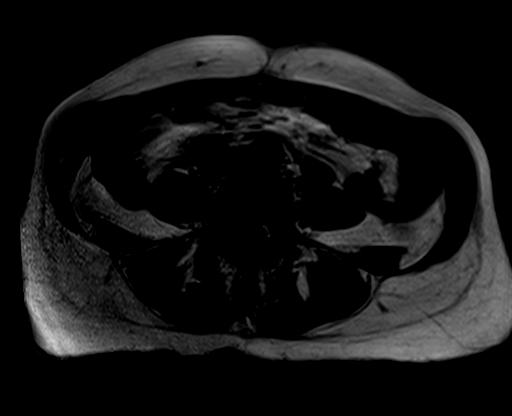

[Series 12: T1 dynamic · axial · 3.0mm · 0.59mm/px · z∈[-58,+155]mm · 3 of 72 slices shown (4 of 4)]
[im 1/72]
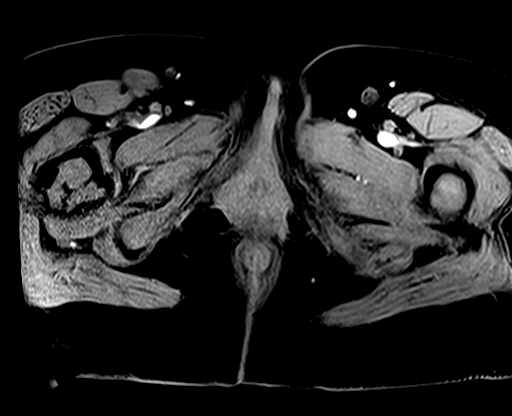
[im 36/72]
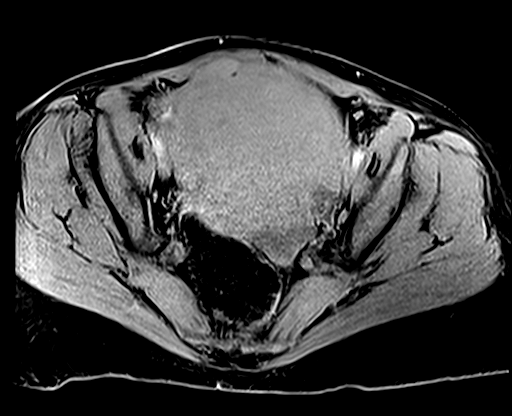
[im 72/72]
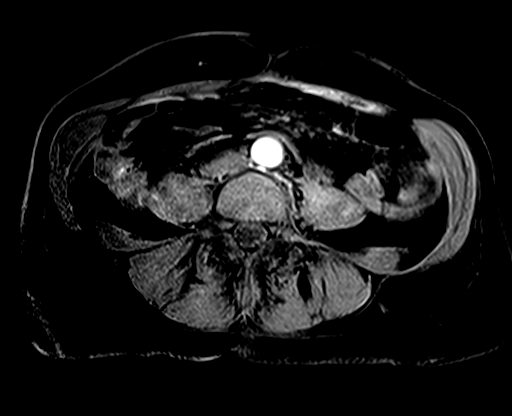

[Series 13: T1 dynamic fat-sat · axial · 3.0mm · 0.59mm/px · z∈[-58,+155]mm · 3 of 72 slices shown (1 of 2)]
[im 1/72]
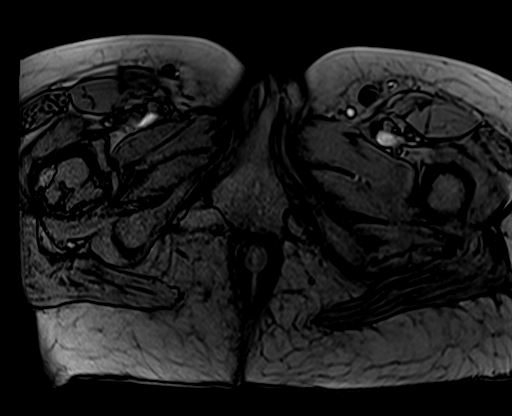
[im 36/72]
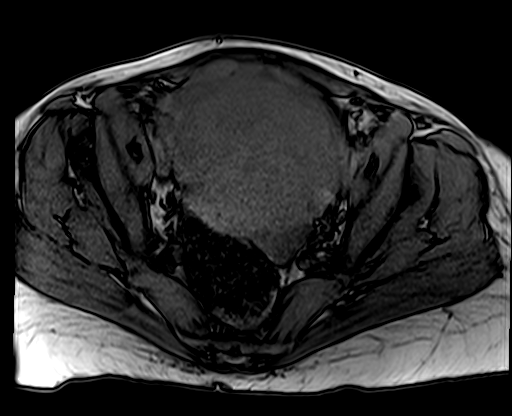
[im 72/72]
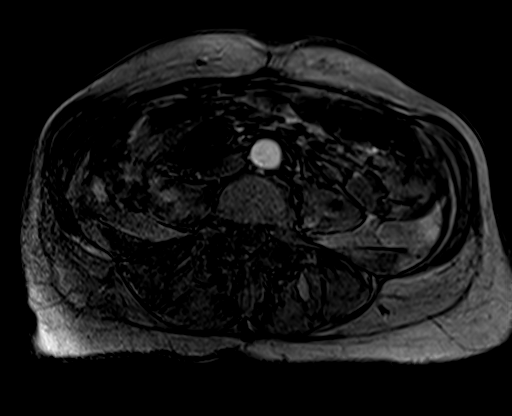

[Series 13: T1 dynamic fat-sat · axial · 3.0mm · 0.59mm/px · z∈[-58,+155]mm · 3 of 72 slices shown (2 of 2)]
[im 1/72]
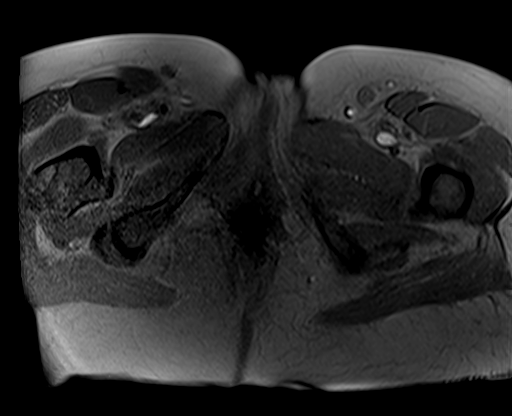
[im 36/72]
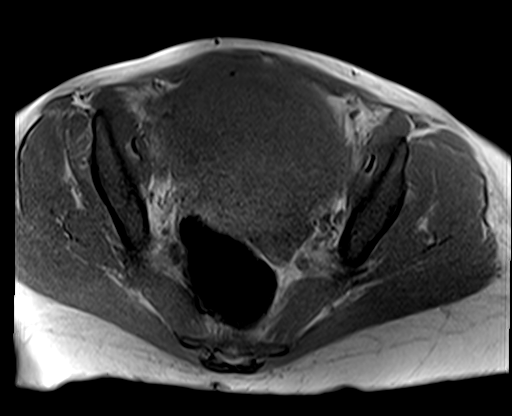
[im 72/72]
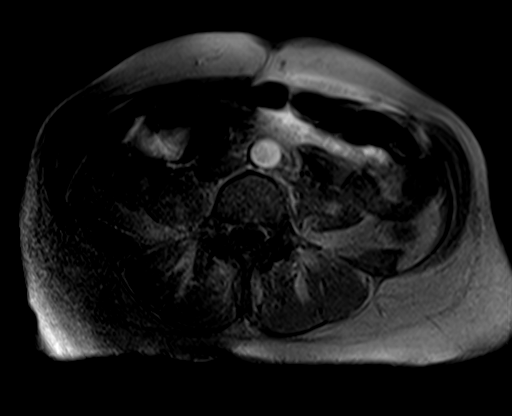

[Series 14: t1_vibe_opp-in_tra_p4_bh · axial · 3.0mm · 0.94mm/px · z∈[-58,+155]mm · 3 of 72 slices shown (1 of 2)]
[im 1/72]
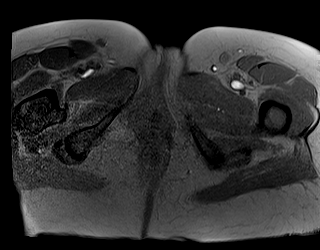
[im 36/72]
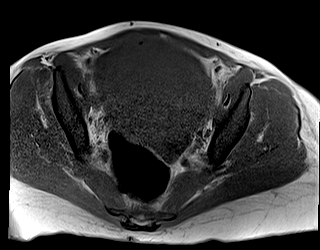
[im 72/72]
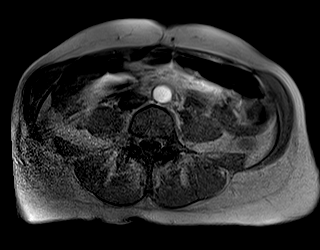

[Series 14: t1_vibe_opp-in_tra_p4_bh · axial · 3.0mm · 0.94mm/px · z∈[-58,+155]mm · 3 of 72 slices shown (2 of 2)]
[im 1/72]
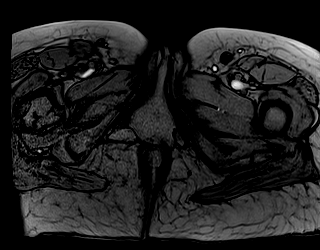
[im 36/72]
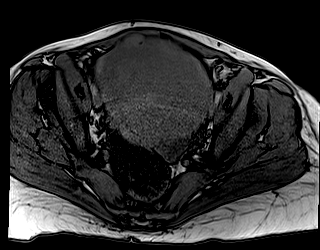
[im 72/72]
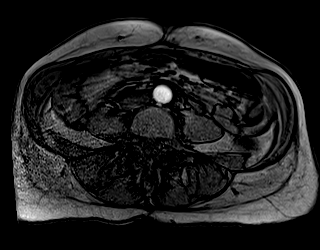

[Series 15: t2_tse_cor · coronal · 4.0mm · 0.94mm/px · 1 of 28 slices shown]
[im 1/28]
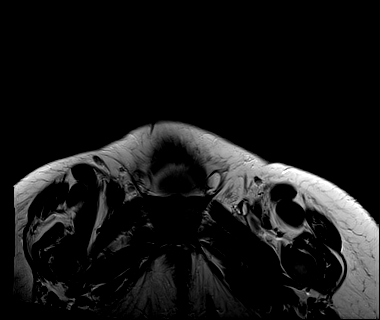

[Series 16: t1_vibe_fs_tra_p4_bh_pre · axial · 3.0mm · 0.88mm/px · z∈[-58,+47]mm · 2 of 72 slices shown]
[im 1/72]
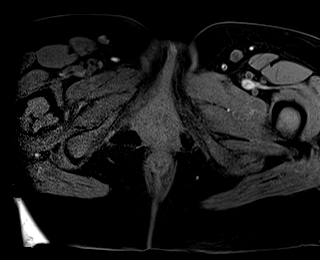
[im 36/72]
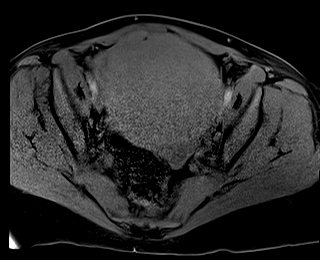

[31 of 48 positions shown; findings below may reference images not displayed]

FINDINGS: Urinary Tract: The visualized distal ureters and bladder appear
unremarkable.

Bowel: No bowel wall thickening, distention or surrounding
inflammation identified within the pelvis.

Vascular/Lymphatic: No enlarged pelvic lymph nodes identified. No
significant vascular findings.

Reproductive:

Uterus: Measures 13.5 x 10.0 x 13.7 cm. As seen on recent CT, there
is a large central uterine mass which measures 10.4 x 8.5 x 9.3 cm.
This mass demonstrates mildly heterogeneous, predominately low T2
signal, homogeneous T1 signal and heterogeneous enhancement
following contrast. There is resulting displacement of the
endometrium and an intrauterine device to the right. Several other
smaller more peripheral uterine masses are present, some of which
were calcified on CT. No extra uterine extension of these masses is
seen.

Endometrium: Distorted and displaced to the right. Intrauterine
device in place. No focal uterine mass identified.

Cervix/Vagina:  Appears normal.

Right ovary:  Appears normal.  No mass identified.

Left ovary:  Appears normal.  No mass identified.

Other: Trace pelvic ascites, within physiologic limits. No
peritoneal nodularity or suspicious enhancement.

Musculoskeletal: Low T2 marrow signal identified within the bony
pelvis and lumbar spine consistent with marrow reconversion related
to anemia. No acute or focal osseous lesions identified.
IMPRESSION: 1. Large central uterine mass with resulting displacement of the
endometrium and an intrauterine device to the right. Several other
smaller uterine masses are present, some of which were calcified on
CT. No aggressive characteristics are demonstrated to suggest
malignancy, and these findings are most consistent with uterine
fibroids. However, fibroids are not reliably differentiated from
leiomyosarcomas by imaging, and if that remains a clinical concern,
surgical excision/hysterectomy should be considered.
2. Normal appearance of the ovaries.
3. Bone marrow reconversion pattern.
